# Patient Record
Sex: Male | Born: 1950 | Race: White | Hispanic: No | Smoking: Former smoker
Health system: Southern US, Community
[De-identification: ages and names within clinical notes are randomized; demographics above are authoritative.]

## PROBLEM LIST (undated history)

## (undated) DIAGNOSIS — M96671 Fracture of tibia or fibula following insertion of orthopedic implant, joint prosthesis, or bone plate, right leg: Secondary | ICD-10-CM

## (undated) DIAGNOSIS — E559 Vitamin D deficiency, unspecified: Secondary | ICD-10-CM

## (undated) DIAGNOSIS — G629 Polyneuropathy, unspecified: Secondary | ICD-10-CM

## (undated) HISTORY — DX: Vitamin D deficiency, unspecified: E55.9

## (undated) HISTORY — DX: Fracture of tibia or fibula following insertion of orthopedic implant, joint prosthesis, or bone plate, right leg: M96.671

## (undated) HISTORY — DX: Rider (driver) (passenger) of other motorcycle injured in unspecified traffic accident, initial encounter: V29.99XA

## (undated) HISTORY — DX: Polyneuropathy, unspecified: G62.9

---

## 2001-01-24 ENCOUNTER — Inpatient Hospital Stay (HOSPITAL_COMMUNITY): Admission: AD | Admit: 2001-01-24 | Discharge: 2001-01-27 | Payer: Self-pay | Admitting: Internal Medicine

## 2001-01-24 ENCOUNTER — Encounter: Payer: Self-pay | Admitting: Internal Medicine

## 2001-01-25 ENCOUNTER — Encounter: Payer: Self-pay | Admitting: Internal Medicine

## 2001-01-26 ENCOUNTER — Encounter: Payer: Self-pay | Admitting: Internal Medicine

## 2001-02-28 ENCOUNTER — Encounter: Payer: Self-pay | Admitting: General Surgery

## 2001-02-28 ENCOUNTER — Ambulatory Visit (HOSPITAL_COMMUNITY): Admission: RE | Admit: 2001-02-28 | Discharge: 2001-02-28 | Payer: Self-pay | Admitting: General Surgery

## 2001-09-16 ENCOUNTER — Inpatient Hospital Stay (HOSPITAL_COMMUNITY): Admission: EM | Admit: 2001-09-16 | Discharge: 2001-09-18 | Payer: Self-pay | Admitting: *Deleted

## 2001-09-24 ENCOUNTER — Inpatient Hospital Stay (HOSPITAL_COMMUNITY): Admission: EM | Admit: 2001-09-24 | Discharge: 2001-09-28 | Payer: Self-pay | Admitting: Psychiatry

## 2001-10-26 ENCOUNTER — Emergency Department (HOSPITAL_COMMUNITY): Admission: EM | Admit: 2001-10-26 | Discharge: 2001-10-26 | Payer: Self-pay | Admitting: Emergency Medicine

## 2001-10-27 ENCOUNTER — Encounter: Payer: Self-pay | Admitting: Emergency Medicine

## 2004-06-08 ENCOUNTER — Ambulatory Visit: Payer: Self-pay | Admitting: Internal Medicine

## 2007-01-17 DIAGNOSIS — F411 Generalized anxiety disorder: Secondary | ICD-10-CM | POA: Insufficient documentation

## 2007-01-17 DIAGNOSIS — F329 Major depressive disorder, single episode, unspecified: Secondary | ICD-10-CM | POA: Insufficient documentation

## 2007-01-17 DIAGNOSIS — Z8719 Personal history of other diseases of the digestive system: Secondary | ICD-10-CM | POA: Insufficient documentation

## 2008-11-15 ENCOUNTER — Emergency Department (HOSPITAL_COMMUNITY): Admission: EM | Admit: 2008-11-15 | Discharge: 2008-11-15 | Payer: Self-pay | Admitting: Emergency Medicine

## 2008-11-17 ENCOUNTER — Telehealth (INDEPENDENT_AMBULATORY_CARE_PROVIDER_SITE_OTHER): Payer: Self-pay | Admitting: *Deleted

## 2010-09-03 NOTE — Discharge Summary (Signed)
[place]. [facility]  Patient:    Richard [name], Richard [name] Visit Number: [id number] MRN: [id number]          Service Type: Attending:  Valetta Mole. Swords, M.D. [facility] Dictated by:   Valetta Mole. Swords, M.D. [facility] Adm. Date:  01/24/01 Disc. Date: 01/27/01   CC:         Gordy Savers, M.D.   Discharge Summary  DISCHARGE DIAGNOSES: 1. Diverticulitis. 2. Ileus secondary to above. 3. Tobacco abuse.  DISCHARGE MEDICATIONS:  Augmentin 500 mg p.o. b.i.d. x 5 days.  HOSPITAL PROCEDURES:  CT scan of the abdomen and pelvis demonstrated an inflammatory process in the low to mid pelvis.  A 2 cm fluid collection within the pelvis was noted thought to be related to diverticulitis.  Also noted were some dilated small bowel loops.  Abdominal film on January 26, 2001, demonstrated partial small bowel obstruction with some interval improvement compared to previous films.  LABORATORY DATA:  B-MET on January 26, 2001, was normal.  Blood cultures are negative to date.  Urinalysis was negative on admission.  The CBC on admission demonstrated a white count of 12.6.  On January 26, 2001, white blood count was 6.5.  DISCHARGE CONDITION:  Improved.  FOLLOW-UP PLANS:  Dr. Gordy Savers in three to four weeks.  HOSPITAL COURSE:  The patient was admitted to the hospital service on January 24, 2001.  The patient was admitted with abdominal pain.  CT scan of the pelvis and abdomen was obtained with results as above.  Surgical consult was obtained.  The patient was treated for diverticulitis with IV antibiotics. He improved over his stay in the hospital.  Initially, IV antibiotics were changed to Ceftin.  I sent him home on Augmentin.  On discharge, the patient was doing well, no abdominal pain, tolerating a general diet. Dictated by:   Valetta Mole Swords, M.D. [facility] Attending:  Valetta Mole. Swords, M.D. [facility] DD:  01/27/01 TD:  01/28/01 Job: [id number]

## 2010-09-03 NOTE — Consult Note (Signed)
[place]. [facility]  Patient:    Richard [name], Richard [name] Visit Number: [id number] MRN: [id number]          Service Type: Attending:  Angelia Mould. Derrell Lolling, M.D. Dictated by:   Angelia Mould. Derrell Lolling, M.D. Proc. Date: 01/24/01                            Consultation Report  REASON FOR CONSULTATION:  Evaluate abdominal pain and fever.  HISTORY OF PRESENT ILLNESS:  This is a healthy 60 year old white man who was feeling well until January 22, 2001.  At noon he noted the rather sudden onset of intraumbilical and suprapubic pain.  The pain has been mostly constant since that time, has gotten a little bit worse, certainly has not improved. This morning it went away for a short period of time and then came back and he presented to Dr. Earley Brooke for evaluation.  The patient has been anorexic, but denies nausea or vomiting.  His bowel movements have changed recently and he has had a decrease stool volume and semi-formed stools but no blood was seen. He has not had any prior episodes of this.  He has no voiding symptoms.  He has no GI history.  He was seen in Dr. Felecia Jan office, he had a fever of 102 and was tender in the lower abdomen.  Dr. Earley Brooke was concerned about appendicitis. Dr. Earley Brooke admitted the patient and I am seeing him in consultation.  PAST MEDICAL HISTORY:  Left breast biopsy for bigynecomastia, otherwise no surgical or medical problems.  CURRENT MEDICATIONS:  None.  ALLERGIES:  None.  FAMILY HISTORY:  Father died at age 31, had a myocardial infarction.  Mother living at age 71, no medical problems.  Brother and sister living and well.  SOCIAL HISTORY:  The patient lives in [place].  He is married.  He has one child, age 31.  Smokes 1/2 pack of cigarettes per day.  Drinks about twelve beers per week.  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  REVIEW OF SYSTEMS:  A 12  system review was performed and it is noncontributory except as described above.  PHYSICAL EXAMINATION:  GENERAL:  Pleasant, middle-aged man who appears fit, in mild distress.  Alert and cooperative.  VITAL SIGNS:  Temperature 99.6, heart rate 106, respiratory rate 20, blood pressure 126/78.  HEENT:  Sclera clear.  Extraocular movements intact.  Oropharynx clear.  NECK:  Supple, nontender, no mass, no bruit.  LUNGS:  Clear to auscultation.  No CVA tenderness.  HEART:  Regular rate and rhythm, no murmur.  ABDOMEN:  Soft, nondistended, bowel sounds are diminished.  He is tender in the suprapubic area, a little bit the right and a little bit to the left.  It is not truly lateralizing at this point.  I do not feel a mass or hernia.  The more lateral right lower quadrant and lateral left lower quadrant are nontender.  GENITALIA:  Normal penis, scrotum and testes.  No hernia.  EXTREMITIES:  No edema, good pulses.  NEUROLOGIC:  Grossly within normal limits.  ADMISSION DATA:  All labs are pending.  IMPRESSION: Lower abdominal pain and fever, question atypical appendicitis, question diverticulitis, question other inflammatory process.  PLAN:  The patient will be admitted, IV fluids and IV antibiotics will be started.  I think it is in his best interest to proceed  with CT scanning now to clarify his diagnosis. Dictated by:   Angelia Mould. Derrell Lolling, M.D. Attending:  Angelia Mould. Derrell Lolling, M.D. DD:  01/24/01 TD:  01/25/01 Job: [id number]

## 2010-09-03 NOTE — Discharge Summary (Signed)
[facility]  Patient:    Richard [name], Richard [name] Visit Number: [id number] MRN: [id number]          Service Type: PSY Location: 500 0507 01 Attending Physician:  Jeanice Lim Dictated by:   Reymundo Poll Dub Mikes, M.D. Admit Date:  09/24/2001 Discharge Date: 09/28/2001                             Discharge Summary  CHIEF COMPLAINT AND HISTORY OF PRESENT ILLNESS:  This was the first admission to [facility] for this 60 year old male voluntarily admitted after a suicidal attempt.  Took blood poison as an impulsive act, took one box; had a 40 ounce beer before.  Left a note to his wife.  Wife showed up and took him to ER.  Marital conflicts.  Wife found out he was having an affair.  He was afraid to face his problems.  Denied any active suicidal ideas, homicidal ideas.  PAST PSYCHIATRIC HISTORY:  First time at [facility].  No previous outpatient treatment.  SUBSTANCE ABUSE HISTORY:  Smokes and drinks 40 ounce beer.  Denies any abuse.  PAST MEDICAL HISTORY:  Noncontributory.  MEDICATIONS ON ADMISSION:  Lexapro 10 mg for five to six months; compliant.  PHYSICAL EXAMINATION:  GENERAL:  Performed and failed to show any acute findings.  MENTAL STATUS EXAMINATION ON ADMISSION:  Alert, middle-aged male.  Speech: Normal and clear, goal directed.  Mood: Euthymic.  Affect: Broad.  Thought processes: Coherent, goal directed.  No suicidal ideas, no homicidal ideas. Cognitive: Well preserved.  ADMITTING DIAGNOSES: Axis I:    Major depression, single episode. Axis II:   No diagnosis. Axis III:  No diagnosis. Axis IV:   Moderate. Axis V:    Global assessment of functioning upon admission 40-45, highest            global assessment of functioning in the last year 70.  LABORATORY DATA:  CBC was within normal limits.  Blood chemistries were within normal limits.  Thyroid profile was within normal limits.  Drug screen was positive  for marijuana.  HOSPITAL COURSE:  He was admitted and started in intensive individual and group psychotherapy.  He worked on Pharmacologist, on Optician, dispensing, on dealing with the issues having to do with the loss of his wife and given the fact that she could not get over him having an affair.  He denied any further suicidal ideations.  On June 3, he stated that he was feeling much better, claimed that the suicidal gesture was an impulsive act; he did not think it through.  Claimed that he had been able to deal with the loss of his wife and he was willing to try to recover what he lost after the affair but if she was not willing to pursue it, he was okay with it.  Upon discharge, no suicidal ideas, no homicidal ideas, willing and motivated to pursue outpatient treatment.  DISCHARGE DIAGNOSES: Axis I:    Major depression, single episode. Axis II:   No diagnosis. Axis III:  No diagnosis. Axis IV:   Moderate. Axis V:    Global assessment of functioning upon discharge 60.  DISCHARGE MEDICATIONS:  Lexapro 20 mg per day.  FOLLOWUP:  Clarisse Gouge and [address]. Dictated by:   Reymundo Poll Dub Mikes, M.D. Attending Physician:  Jeanice Lim DD:  10/24/01 TD:  10/24/01 Job: [id number]
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
## 2010-09-03 NOTE — H&P (Signed)
[place]. [facility]  Patient:    Richard [name], Richard [name] Visit Number: [id number] MRN: [id number]          Service Type: Attending:  Gordy Savers, M.D. Dictated by:   Gordy Savers, M.D. Adm. Date:  01/24/01                           History and Physical  CHIEF COMPLAINT:  Abdominal pain.  HISTORY OF PRESENT ILLNESS:  The patient is a 60 year old gentleman who was seen on the day of admission as a new patient, complaining of crampy abdominal pain of three days duration.  He describes a constant aching pain with paroxysmal cramping and sharp shooting pain.  This was aggravated by movement and coughing.  He denies any real nausea or vomiting but has been quite anorexic.  He has had daily bowel movements, but these have been much looser than his norm.  PAST MEDICAL HISTORY:  Unremarkable and has had no hospital admissions and no prior surgeries.  Associated symptoms also included fever to as high as 102 degrees.  The patient was seen in the office where he was noted to be quite tender with a temperature of 101.8 and is now admitted to rule out acute appendicitis.  ALLERGIES:  He has no known allergies.  HABITS:  He is a less than one half pack per day smoker and drinks socially.  MEDICINES:  No chronic medications, has been taking occasional Advil for his pain.  FAMILY HISTORY:  Fairly noncontributory.  Father died at age 59 of an MI. Mother, age 62, is in reasonably good health.  One brother age 19, one sister age 33, both in good health.  PHYSICAL EXAMINATION:  VITAL SIGNS:  Temperature 101.8, pulse rate 108.  GENERAL:  Revealed a well-developed, thin, healthy-appearing male who was uncomfortable.  SKIN:  Unremarkable without rash.  HEENT:  Revealed normal fundi, ears, nose, and throat clear, anicteric.  NECK:  Revealed no adenopathy or neck pain or distention.  CHEST:  Clear.  CARDIOVASCULAR:  S1, S2 normal.  Unremarkable except  for the resting tachycardia.  ABDOMEN:  Soft and nondistended.  He did have a soft epigastric bruit.  He had tenderness fairly diffusely, but max on the right lower quadrant.  He did have considerable tenderness in the periumbilical area and suprapubic area as well. Mild rebound tenderness was noted.  GENITALIA:  External genitalia was normal.  RECTAL:  Revealed Heme-negative stool.  EXTREMITIES:  Negative.  Peripheral pulses full.  IMPRESSION:  Fever, abdominal pain, rule out acute appendicitis.  DISPOSITION:  Will admit to the hospital.  LABORATORY STUDIES:  Included a white count and chemistries will be obtained. Blood cultures will also be obtained.  A CT abdominal scan with contrast will be obtained to rule out acute appendicitis.  Surgical consult will also be obtained tonight. Dictated by:   Gordy Savers, M.D. Attending:  Gordy Savers, M.D. DD:  01/24/01 TD:  01/24/01 Job: [id number]
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
## 2011-01-21 ENCOUNTER — Telehealth: Payer: Self-pay | Admitting: Internal Medicine

## 2011-01-21 NOTE — Telephone Encounter (Signed)
yes

## 2011-01-21 NOTE — Telephone Encounter (Signed)
Pt would like to have psa bloodwork. Can I sch?

## 2011-01-24 NOTE — Telephone Encounter (Signed)
PSA sch for 02-19-2011

## 2011-01-24 NOTE — Telephone Encounter (Signed)
lmom 

## 2011-02-18 ENCOUNTER — Other Ambulatory Visit (INDEPENDENT_AMBULATORY_CARE_PROVIDER_SITE_OTHER): Payer: Self-pay

## 2011-02-18 DIAGNOSIS — Z125 Encounter for screening for malignant neoplasm of prostate: Secondary | ICD-10-CM

## 2011-02-18 LAB — PSA: PSA: 1.47 ng/mL (ref 0.10–4.00)

## 2011-02-24 ENCOUNTER — Telehealth: Payer: Self-pay | Admitting: *Deleted

## 2011-02-24 NOTE — Telephone Encounter (Signed)
Please call/notify patient that lab/test/procedure is normal 

## 2011-02-24 NOTE — Telephone Encounter (Signed)
Pt is aware that lab results are normal.

## 2011-02-24 NOTE — Telephone Encounter (Signed)
Pt is asking for lab results.

## 2011-02-24 NOTE — Telephone Encounter (Signed)
Please call pt with lab results

## 2014-10-09 ENCOUNTER — Telehealth: Payer: Self-pay | Admitting: Internal Medicine

## 2014-10-09 NOTE — Telephone Encounter (Signed)
Error/njr °

## 2014-10-24 ENCOUNTER — Emergency Department (HOSPITAL_COMMUNITY): Payer: BLUE CROSS/BLUE SHIELD

## 2014-10-24 ENCOUNTER — Encounter (HOSPITAL_COMMUNITY): Payer: Self-pay | Admitting: Emergency Medicine

## 2014-10-24 ENCOUNTER — Inpatient Hospital Stay (HOSPITAL_COMMUNITY): Payer: BLUE CROSS/BLUE SHIELD | Admitting: Anesthesiology

## 2014-10-24 ENCOUNTER — Inpatient Hospital Stay (HOSPITAL_COMMUNITY): Payer: BLUE CROSS/BLUE SHIELD

## 2014-10-24 ENCOUNTER — Inpatient Hospital Stay (HOSPITAL_COMMUNITY)
Admission: EM | Admit: 2014-10-24 | Discharge: 2014-10-31 | DRG: 493 | Disposition: A | Discharge status: Skilled Nursing Facility | Payer: BLUE CROSS/BLUE SHIELD | Attending: Orthopedic Surgery | Admitting: Orthopedic Surgery

## 2014-10-24 ENCOUNTER — Encounter (HOSPITAL_COMMUNITY)
Admission: EM | Disposition: A | Discharge status: Skilled Nursing Facility | Payer: Self-pay | Source: Home / Self Care | Attending: Orthopedic Surgery

## 2014-10-24 DIAGNOSIS — R52 Pain, unspecified: Secondary | ICD-10-CM

## 2014-10-24 DIAGNOSIS — S50811A Abrasion of right forearm, initial encounter: Secondary | ICD-10-CM | POA: Diagnosis present

## 2014-10-24 DIAGNOSIS — S82201A Unspecified fracture of shaft of right tibia, initial encounter for closed fracture: Secondary | ICD-10-CM

## 2014-10-24 DIAGNOSIS — S42001B Fracture of unspecified part of right clavicle, initial encounter for open fracture: Secondary | ICD-10-CM

## 2014-10-24 DIAGNOSIS — R112 Nausea with vomiting, unspecified: Secondary | ICD-10-CM | POA: Diagnosis not present

## 2014-10-24 DIAGNOSIS — S0101XA Laceration without foreign body of scalp, initial encounter: Secondary | ICD-10-CM | POA: Diagnosis present

## 2014-10-24 DIAGNOSIS — S82451A Displaced comminuted fracture of shaft of right fibula, initial encounter for closed fracture: Secondary | ICD-10-CM | POA: Diagnosis present

## 2014-10-24 DIAGNOSIS — S82899A Other fracture of unspecified lower leg, initial encounter for closed fracture: Secondary | ICD-10-CM

## 2014-10-24 DIAGNOSIS — Y92410 Unspecified street and highway as the place of occurrence of the external cause: Secondary | ICD-10-CM

## 2014-10-24 DIAGNOSIS — S82401A Unspecified fracture of shaft of right fibula, initial encounter for closed fracture: Secondary | ICD-10-CM

## 2014-10-24 DIAGNOSIS — S82871A Displaced pilon fracture of right tibia, initial encounter for closed fracture: Principal | ICD-10-CM | POA: Diagnosis present

## 2014-10-24 DIAGNOSIS — S50311A Abrasion of right elbow, initial encounter: Secondary | ICD-10-CM | POA: Diagnosis present

## 2014-10-24 DIAGNOSIS — S42001A Fracture of unspecified part of right clavicle, initial encounter for closed fracture: Secondary | ICD-10-CM

## 2014-10-24 DIAGNOSIS — Z419 Encounter for procedure for purposes other than remedying health state, unspecified: Secondary | ICD-10-CM

## 2014-10-24 DIAGNOSIS — D62 Acute posthemorrhagic anemia: Secondary | ICD-10-CM | POA: Diagnosis not present

## 2014-10-24 DIAGNOSIS — M25571 Pain in right ankle and joints of right foot: Secondary | ICD-10-CM | POA: Diagnosis not present

## 2014-10-24 DIAGNOSIS — I998 Other disorder of circulatory system: Secondary | ICD-10-CM

## 2014-10-24 DIAGNOSIS — Z87891 Personal history of nicotine dependence: Secondary | ICD-10-CM | POA: Diagnosis not present

## 2014-10-24 DIAGNOSIS — S42009A Fracture of unspecified part of unspecified clavicle, initial encounter for closed fracture: Secondary | ICD-10-CM

## 2014-10-24 DIAGNOSIS — S42024A Nondisplaced fracture of shaft of right clavicle, initial encounter for closed fracture: Secondary | ICD-10-CM | POA: Diagnosis present

## 2014-10-24 DIAGNOSIS — S40811A Abrasion of right upper arm, initial encounter: Secondary | ICD-10-CM | POA: Diagnosis present

## 2014-10-24 DIAGNOSIS — T1490XA Injury, unspecified, initial encounter: Secondary | ICD-10-CM

## 2014-10-24 DIAGNOSIS — S82899B Other fracture of unspecified lower leg, initial encounter for open fracture type I or II: Secondary | ICD-10-CM

## 2014-10-24 HISTORY — PX: I & D EXTREMITY: SHX5045

## 2014-10-24 HISTORY — PX: EXTERNAL FIXATION LEG: SHX1549

## 2014-10-24 LAB — COMPREHENSIVE METABOLIC PANEL
ALK PHOS: 52 U/L (ref 38–126)
ALT: 25 U/L (ref 17–63)
ANION GAP: 12 (ref 5–15)
AST: 42 U/L — ABNORMAL HIGH (ref 15–41)
Albumin: 3.3 g/dL — ABNORMAL LOW (ref 3.5–5.0)
BUN: 23 mg/dL — ABNORMAL HIGH (ref 6–20)
CHLORIDE: 109 mmol/L (ref 101–111)
CO2: 19 mmol/L — AB (ref 22–32)
CREATININE: 1.38 mg/dL — AB (ref 0.61–1.24)
Calcium: 8.5 mg/dL — ABNORMAL LOW (ref 8.9–10.3)
GFR calc Af Amer: >60 mL/min (ref >60)
GFR, EST NON AFRICAN AMERICAN: 54 mL/min — AB (ref >60)
GLUCOSE: 117 mg/dL — AB (ref 65–99)
Potassium: 4.1 mmol/L (ref 3.5–5.1)
Sodium: 140 mmol/L (ref 135–145)
Total Bilirubin: 0.6 mg/dL (ref 0.3–1.2)
Total Protein: 5.7 g/dL — ABNORMAL LOW (ref 6.5–8.1)

## 2014-10-24 LAB — CBC
HCT: 37.2 % — ABNORMAL LOW (ref 39.0–52.0)
HEMOGLOBIN: 12.8 g/dL — AB (ref 13.0–17.0)
MCH: 30.6 pg (ref 26.0–34.0)
MCHC: 34.4 g/dL (ref 30.0–36.0)
MCV: 89 fL (ref 78.0–100.0)
PLATELETS: 374 10*3/uL (ref 150–400)
RBC: 4.18 MIL/uL — ABNORMAL LOW (ref 4.22–5.81)
RDW: 12.9 % (ref 11.5–15.5)
WBC: 7.5 10*3/uL (ref 4.0–10.5)

## 2014-10-24 LAB — PREPARE FRESH FROZEN PLASMA
Unit division: 0
Unit division: 0

## 2014-10-24 LAB — PROTIME-INR
INR: 1.05 (ref 0.00–1.49)
Prothrombin Time: 13.9 seconds (ref 11.6–15.2)

## 2014-10-24 LAB — ETHANOL: ALCOHOL ETHYL (B): 36 mg/dL — AB (ref <5)

## 2014-10-24 LAB — ABO/RH: ABO/RH(D): O POS

## 2014-10-24 LAB — CDS SEROLOGY

## 2014-10-24 SURGERY — EXTERNAL FIXATION, LOWER EXTREMITY
Anesthesia: General | Site: Leg Upper | Laterality: Right

## 2014-10-24 MED ORDER — ONDANSETRON HCL 4 MG/2ML IJ SOLN
4.0000 mg | Freq: Four times a day (QID) | INTRAMUSCULAR | Status: DC | PRN
Start: 2014-10-24 — End: 2014-11-01
  Administered 2014-10-25 – 2014-10-28 (×5): 4 mg via INTRAVENOUS
  Filled 2014-10-24 (×5): qty 2

## 2014-10-24 MED ORDER — ONDANSETRON HCL 4 MG PO TABS: 4.0000 mg | ORAL_TABLET | Freq: Four times a day (QID) | ORAL | Status: DC | PRN

## 2014-10-24 MED ORDER — FENTANYL CITRATE (PF) 100 MCG/2ML IJ SOLN
INTRAMUSCULAR | Status: DC | PRN
  Administered 2014-10-24: 100 ug via INTRAVENOUS
  Administered 2014-10-24 (×3): 50 ug via INTRAVENOUS

## 2014-10-24 MED ORDER — BISACODYL 10 MG RE SUPP
10.0000 mg | Freq: Every day | RECTAL | Status: DC | PRN
  Administered 2014-10-30: 10 mg via RECTAL
  Filled 2014-10-24: qty 1

## 2014-10-24 MED ORDER — PROPOFOL 10 MG/ML IV BOLUS
INTRAVENOUS | Status: DC | PRN
  Administered 2014-10-24: 200 mg via INTRAVENOUS

## 2014-10-24 MED ORDER — ETOMIDATE 2 MG/ML IV SOLN
INTRAVENOUS | Status: AC | PRN
  Administered 2014-10-24: 15 mg via INTRAVENOUS

## 2014-10-24 MED ORDER — GLYCOPYRROLATE 0.2 MG/ML IJ SOLN
INTRAMUSCULAR | Status: AC
  Filled 2014-10-24: qty 2

## 2014-10-24 MED ORDER — ENOXAPARIN SODIUM 40 MG/0.4ML ~~LOC~~ SOLN
40.0000 mg | SUBCUTANEOUS | Status: DC
  Administered 2014-10-25 – 2014-10-27 (×3): 40 mg via SUBCUTANEOUS
  Filled 2014-10-24 (×4): qty 0.4

## 2014-10-24 MED ORDER — MORPHINE SULFATE 2 MG/ML IJ SOLN
INTRAMUSCULAR | Status: AC
  Filled 2014-10-24: qty 3

## 2014-10-24 MED ORDER — SILVER SULFADIAZINE 1 % EX CREA
TOPICAL_CREAM | CUTANEOUS | Status: AC
  Filled 2014-10-24: qty 85

## 2014-10-24 MED ORDER — DOCUSATE SODIUM 100 MG PO CAPS
100.0000 mg | ORAL_CAPSULE | Freq: Two times a day (BID) | ORAL | Status: DC
  Administered 2014-10-25 – 2014-10-31 (×12): 100 mg via ORAL
  Filled 2014-10-24 (×13): qty 1

## 2014-10-24 MED ORDER — CEFAZOLIN SODIUM-DEXTROSE 2-3 GM-% IV SOLR
INTRAVENOUS | Status: AC
  Administered 2014-10-24: 2000 mg
  Filled 2014-10-24: qty 50

## 2014-10-24 MED ORDER — PANTOPRAZOLE SODIUM 40 MG PO TBEC
40.0000 mg | DELAYED_RELEASE_TABLET | Freq: Every day | ORAL | Status: DC
  Administered 2014-10-26 – 2014-10-31 (×5): 40 mg via ORAL
  Filled 2014-10-24 (×5): qty 1

## 2014-10-24 MED ORDER — ONDANSETRON HCL 4 MG/2ML IJ SOLN: 4.0000 mg | Freq: Four times a day (QID) | INTRAMUSCULAR | Status: DC | PRN

## 2014-10-24 MED ORDER — GLYCOPYRROLATE 0.2 MG/ML IJ SOLN
INTRAMUSCULAR | Status: DC | PRN
  Administered 2014-10-24: 0.4 mg via INTRAVENOUS

## 2014-10-24 MED ORDER — ROCURONIUM BROMIDE 50 MG/5ML IV SOLN
INTRAVENOUS | Status: AC
  Filled 2014-10-24: qty 1

## 2014-10-24 MED ORDER — FENTANYL CITRATE (PF) 100 MCG/2ML IJ SOLN
INTRAMUSCULAR | Status: AC
  Filled 2014-10-24: qty 2

## 2014-10-24 MED ORDER — NEOSTIGMINE METHYLSULFATE 10 MG/10ML IV SOLN
INTRAVENOUS | Status: AC
  Filled 2014-10-24: qty 1

## 2014-10-24 MED ORDER — PANTOPRAZOLE SODIUM 40 MG IV SOLR
40.0000 mg | Freq: Every day | INTRAVENOUS | Status: DC
  Administered 2014-10-25: 40 mg via INTRAVENOUS
  Filled 2014-10-24 (×2): qty 40

## 2014-10-24 MED ORDER — ACETAMINOPHEN 650 MG RE SUPP: 650.0000 mg | Freq: Four times a day (QID) | RECTAL | Status: DC | PRN

## 2014-10-24 MED ORDER — DOCUSATE SODIUM 100 MG PO CAPS: 100.0000 mg | ORAL_CAPSULE | Freq: Two times a day (BID) | ORAL | Status: DC

## 2014-10-24 MED ORDER — HYDROMORPHONE HCL 1 MG/ML IJ SOLN
INTRAMUSCULAR | Status: AC
  Administered 2014-10-24: 0.5 mg via INTRAVENOUS
  Filled 2014-10-24: qty 1

## 2014-10-24 MED ORDER — MEPERIDINE HCL 25 MG/ML IJ SOLN: 6.2500 mg | INTRAMUSCULAR | Status: DC | PRN

## 2014-10-24 MED ORDER — LACTATED RINGERS IV SOLN
INTRAVENOUS | Status: DC | PRN
  Administered 2014-10-24: 20:00:00 via INTRAVENOUS

## 2014-10-24 MED ORDER — ENOXAPARIN SODIUM 30 MG/0.3ML ~~LOC~~ SOLN: 30.0000 mg | SUBCUTANEOUS | Status: DC

## 2014-10-24 MED ORDER — SODIUM CHLORIDE 0.9 % IV SOLN
INTRAVENOUS | Status: AC | PRN
  Administered 2014-10-24 (×2): 1000 mL via INTRAVENOUS

## 2014-10-24 MED ORDER — METOCLOPRAMIDE HCL 5 MG PO TABS
5.0000 mg | ORAL_TABLET | Freq: Three times a day (TID) | ORAL | Status: DC | PRN
  Filled 2014-10-24: qty 2

## 2014-10-24 MED ORDER — CEFAZOLIN SODIUM 1-5 GM-% IV SOLN
1.0000 g | Freq: Four times a day (QID) | INTRAVENOUS | Status: AC
  Administered 2014-10-25 – 2014-10-27 (×12): 1 g via INTRAVENOUS
  Filled 2014-10-24 (×14): qty 50

## 2014-10-24 MED ORDER — LIDOCAINE HCL (CARDIAC) 20 MG/ML IV SOLN
INTRAVENOUS | Status: AC
  Filled 2014-10-24: qty 5

## 2014-10-24 MED ORDER — HYDROMORPHONE HCL 1 MG/ML IJ SOLN
1.0000 mg | INTRAMUSCULAR | Status: DC | PRN
  Administered 2014-10-25: 1 mg via INTRAVENOUS
  Filled 2014-10-24: qty 1

## 2014-10-24 MED ORDER — MIDAZOLAM HCL 5 MG/5ML IJ SOLN
INTRAMUSCULAR | Status: DC | PRN
  Administered 2014-10-24: 2 mg via INTRAVENOUS

## 2014-10-24 MED ORDER — HYDROMORPHONE HCL 1 MG/ML IJ SOLN
0.2500 mg | INTRAMUSCULAR | Status: DC | PRN
  Administered 2014-10-24 (×4): 0.5 mg via INTRAVENOUS

## 2014-10-24 MED ORDER — SODIUM CHLORIDE 0.9 % IV SOLN
INTRAVENOUS | Status: DC
  Administered 2014-10-24: 125 mL/h via INTRAVENOUS
  Administered 2014-10-25: 125 mL via INTRAVENOUS
  Administered 2014-10-25 – 2014-10-26 (×3): via INTRAVENOUS

## 2014-10-24 MED ORDER — ONDANSETRON HCL 4 MG/2ML IJ SOLN
INTRAMUSCULAR | Status: AC
  Filled 2014-10-24: qty 2

## 2014-10-24 MED ORDER — ALBUMIN HUMAN 5 % IV SOLN
INTRAVENOUS | Status: DC | PRN
  Administered 2014-10-24: 21:00:00 via INTRAVENOUS

## 2014-10-24 MED ORDER — SODIUM CHLORIDE 0.9 % IR SOLN
Status: DC | PRN
  Administered 2014-10-24: 1000 mL

## 2014-10-24 MED ORDER — CEFAZOLIN SODIUM-DEXTROSE 2-3 GM-% IV SOLR
INTRAVENOUS | Status: DC | PRN
  Administered 2014-10-24: 2 g via INTRAVENOUS

## 2014-10-24 MED ORDER — FENTANYL CITRATE (PF) 100 MCG/2ML IJ SOLN
INTRAMUSCULAR | Status: AC | PRN
  Administered 2014-10-24: 100 ug via INTRAVENOUS

## 2014-10-24 MED ORDER — FENTANYL CITRATE (PF) 100 MCG/2ML IJ SOLN
100.0000 ug | Freq: Once | INTRAMUSCULAR | Status: AC
  Administered 2014-10-24: 100 ug via INTRAVENOUS

## 2014-10-24 MED ORDER — ROCURONIUM BROMIDE 100 MG/10ML IV SOLN
INTRAVENOUS | Status: DC | PRN
  Administered 2014-10-24: 25 mg via INTRAVENOUS

## 2014-10-24 MED ORDER — PROPOFOL 10 MG/ML IV BOLUS
INTRAVENOUS | Status: AC
  Filled 2014-10-24: qty 20

## 2014-10-24 MED ORDER — OXYCODONE HCL 5 MG/5ML PO SOLN: 5.0000 mg | Freq: Once | ORAL | Status: DC | PRN

## 2014-10-24 MED ORDER — NEOSTIGMINE METHYLSULFATE 10 MG/10ML IV SOLN
INTRAVENOUS | Status: DC | PRN
  Administered 2014-10-24: 4 mg via INTRAVENOUS

## 2014-10-24 MED ORDER — OXYCODONE HCL 5 MG PO TABS: 5.0000 mg | ORAL_TABLET | Freq: Once | ORAL | Status: DC | PRN

## 2014-10-24 MED ORDER — LIDOCAINE HCL (CARDIAC) 20 MG/ML IV SOLN
INTRAVENOUS | Status: DC | PRN
  Administered 2014-10-24: 50 mg via INTRAVENOUS

## 2014-10-24 MED ORDER — CEFAZOLIN SODIUM-DEXTROSE 2-3 GM-% IV SOLR
INTRAVENOUS | Status: AC
  Filled 2014-10-24: qty 50

## 2014-10-24 MED ORDER — CEFAZOLIN SODIUM 1-5 GM-% IV SOLN: 1.0000 g | Freq: Three times a day (TID) | INTRAVENOUS | Status: DC

## 2014-10-24 MED ORDER — HYDROMORPHONE HCL 1 MG/ML IJ SOLN
0.5000 mg | INTRAMUSCULAR | Status: DC | PRN
  Administered 2014-10-24 (×2): 0.5 mg via INTRAVENOUS

## 2014-10-24 MED ORDER — SUCCINYLCHOLINE CHLORIDE 20 MG/ML IJ SOLN
INTRAMUSCULAR | Status: DC | PRN
  Administered 2014-10-24: 140 mg via INTRAVENOUS

## 2014-10-24 MED ORDER — FENTANYL CITRATE (PF) 250 MCG/5ML IJ SOLN
INTRAMUSCULAR | Status: AC
  Filled 2014-10-24: qty 5

## 2014-10-24 MED ORDER — ONDANSETRON HCL 4 MG/2ML IJ SOLN
INTRAMUSCULAR | Status: DC | PRN
  Administered 2014-10-24: 4 mg via INTRAVENOUS

## 2014-10-24 MED ORDER — MORPHINE SULFATE 4 MG/ML IJ SOLN
6.0000 mg | Freq: Once | INTRAMUSCULAR | Status: AC
  Administered 2014-10-24: 6 mg via INTRAVENOUS

## 2014-10-24 MED ORDER — SUCCINYLCHOLINE CHLORIDE 20 MG/ML IJ SOLN
INTRAMUSCULAR | Status: AC
  Filled 2014-10-24: qty 1

## 2014-10-24 MED ORDER — HYDROMORPHONE HCL 1 MG/ML IJ SOLN
1.0000 mg | INTRAMUSCULAR | Status: DC | PRN
  Administered 2014-10-25: 1 mg via INTRAVENOUS
  Administered 2014-10-25 (×2): 2 mg via INTRAVENOUS
  Administered 2014-10-25 (×2): 1 mg via INTRAVENOUS
  Administered 2014-10-25: 2 mg via INTRAVENOUS
  Administered 2014-10-26: 1 mg via INTRAVENOUS
  Administered 2014-10-26 (×2): 2 mg via INTRAVENOUS
  Filled 2014-10-24: qty 2
  Filled 2014-10-24: qty 1
  Filled 2014-10-24: qty 2
  Filled 2014-10-24 (×2): qty 1
  Filled 2014-10-24 (×4): qty 2

## 2014-10-24 MED ORDER — MIDAZOLAM HCL 2 MG/2ML IJ SOLN
INTRAMUSCULAR | Status: AC
  Filled 2014-10-24: qty 2

## 2014-10-24 MED ORDER — OXYCODONE HCL 5 MG PO TABS
5.0000 mg | ORAL_TABLET | ORAL | Status: DC | PRN
  Administered 2014-10-24 – 2014-10-25 (×2): 10 mg via ORAL
  Administered 2014-10-25: 5 mg via ORAL
  Administered 2014-10-25 (×2): 10 mg via ORAL
  Filled 2014-10-24 (×2): qty 2
  Filled 2014-10-24: qty 1
  Filled 2014-10-24 (×2): qty 2

## 2014-10-24 MED ORDER — PHENYLEPHRINE HCL 10 MG/ML IJ SOLN
INTRAMUSCULAR | Status: DC | PRN
  Administered 2014-10-24 (×3): 80 ug via INTRAVENOUS

## 2014-10-24 MED ORDER — ACETAMINOPHEN 325 MG PO TABS
650.0000 mg | ORAL_TABLET | Freq: Four times a day (QID) | ORAL | Status: DC | PRN
  Administered 2014-10-29: 650 mg via ORAL
  Filled 2014-10-24: qty 2

## 2014-10-24 MED ORDER — METOCLOPRAMIDE HCL 5 MG/ML IJ SOLN
5.0000 mg | Freq: Three times a day (TID) | INTRAMUSCULAR | Status: DC | PRN
  Filled 2014-10-24: qty 2

## 2014-10-24 SURGICAL SUPPLY — 61 items
BANDAGE ELASTIC 4 VELCRO ST LF (GAUZE/BANDAGES/DRESSINGS) ×3 IMPLANT
BANDAGE ELASTIC 6 VELCRO ST LF (GAUZE/BANDAGES/DRESSINGS) ×3 IMPLANT
BNDG COHESIVE 6X5 TAN STRL LF (GAUZE/BANDAGES/DRESSINGS) ×3 IMPLANT
BNDG GAUZE ELAST 4 BULKY (GAUZE/BANDAGES/DRESSINGS) ×3 IMPLANT
CANISTER WOUND CARE 500ML ATS (WOUND CARE) ×3 IMPLANT
COVER SURGICAL LIGHT HANDLE (MISCELLANEOUS) ×3 IMPLANT
DRAPE C-ARM 42X72 X-RAY (DRAPES) IMPLANT
DRAPE C-ARMOR (DRAPES) ×3 IMPLANT
DRAPE INCISE IOBAN 66X45 STRL (DRAPES) ×3 IMPLANT
DRAPE U-SHAPE 47X51 STRL (DRAPES) ×3 IMPLANT
DRSG VAC ATS SM SENSATRAC (GAUZE/BANDAGES/DRESSINGS) ×3 IMPLANT
ELECT REM PT RETURN 9FT ADLT (ELECTROSURGICAL) ×3
ELECTRODE REM PT RTRN 9FT ADLT (ELECTROSURGICAL) ×2 IMPLANT
GAUZE SPONGE 4X4 12PLY STRL (GAUZE/BANDAGES/DRESSINGS) ×3 IMPLANT
GAUZE XEROFORM 5X9 LF (GAUZE/BANDAGES/DRESSINGS) ×3 IMPLANT
GLOVE BIO SURGEON STRL SZ7.5 (GLOVE) ×9 IMPLANT
GLOVE BIOGEL PI IND STRL 6.5 (GLOVE) ×2 IMPLANT
GLOVE BIOGEL PI IND STRL 7.0 (GLOVE) ×2 IMPLANT
GLOVE BIOGEL PI IND STRL 7.5 (GLOVE) ×4 IMPLANT
GLOVE BIOGEL PI IND STRL 8 (GLOVE) ×2 IMPLANT
GLOVE BIOGEL PI INDICATOR 6.5 (GLOVE) ×1
GLOVE BIOGEL PI INDICATOR 7.0 (GLOVE) ×1
GLOVE BIOGEL PI INDICATOR 7.5 (GLOVE) ×2
GLOVE BIOGEL PI INDICATOR 8 (GLOVE) ×1
GLOVE ECLIPSE 7.0 STRL STRAW (GLOVE) ×6 IMPLANT
GLOVE ECLIPSE 8.0 STRL XLNG CF (GLOVE) ×3 IMPLANT
GLOVE NEODERM STRL 7.5 LF PF (GLOVE) IMPLANT
GLOVE SURG NEODERM 7.5  LF PF (GLOVE)
GOWN STRL REIN XL XLG (GOWN DISPOSABLE) ×3 IMPLANT
HANDPIECE INTERPULSE COAX TIP (DISPOSABLE)
KIT BASIN OR (CUSTOM PROCEDURE TRAY) ×3 IMPLANT
KIT ROOM TURNOVER OR (KITS) ×3 IMPLANT
NEEDLE 22X1 1/2 (OR ONLY) (NEEDLE) IMPLANT
NS IRRIG 1000ML POUR BTL (IV SOLUTION) ×3 IMPLANT
PACK ORTHO EXTREMITY (CUSTOM PROCEDURE TRAY) ×3 IMPLANT
PAD ARMBOARD 7.5X6 YLW CONV (MISCELLANEOUS) ×6 IMPLANT
PAD NEG PRESSURE SENSATRAC (MISCELLANEOUS) ×3 IMPLANT
PADDING CAST COTTON 6X4 STRL (CAST SUPPLIES) ×9 IMPLANT
PIN APEX 5X180MM EXFIX (EXFIX) ×6 IMPLANT
PIN CLAMP 5H 30DEG POST (EXFIX) ×3 IMPLANT
PIN TO ROD COUPLING EXFIX (EXFIX) ×12 IMPLANT
PIN TRANSFIXING EXFIX (EXFIX) ×6 IMPLANT
ROD HOFFMANN3 CONNECT 11X150 (EXFIX) ×6 IMPLANT
ROD HOFFMANN3 CONNECT 11X350 (EXFIX) ×6 IMPLANT
ROD TO ROD COUPLING EXFIX (EXFIX) ×12 IMPLANT
SET CYSTO W/LG BORE CLAMP LF (SET/KITS/TRAYS/PACK) ×3 IMPLANT
SET HNDPC FAN SPRY TIP SCT (DISPOSABLE) IMPLANT
SLING ARM FOAM STRAP XLG (SOFTGOODS) ×3 IMPLANT
SPONGE LAP 18X18 X RAY DECT (DISPOSABLE) ×3 IMPLANT
SPONGE SCRUB IODOPHOR (GAUZE/BANDAGES/DRESSINGS) ×3 IMPLANT
STAPLER VISISTAT 35W (STAPLE) IMPLANT
STOCKINETTE IMPERVIOUS LG (DRAPES) ×3 IMPLANT
SUT ETHILON 2 0 FS 18 (SUTURE) ×9 IMPLANT
SUT ETHILON 3 0 PS 1 (SUTURE) ×3 IMPLANT
SYR CONTROL 10ML LL (SYRINGE) IMPLANT
TOWEL OR 17X24 6PK STRL BLUE (TOWEL DISPOSABLE) ×6 IMPLANT
TOWEL OR 17X26 10 PK STRL BLUE (TOWEL DISPOSABLE) ×6 IMPLANT
TUBE CONNECTING 12X1/4 (SUCTIONS) ×3 IMPLANT
UNDERPAD 30X30 INCONTINENT (UNDERPADS AND DIAPERS) ×3 IMPLANT
WATER STERILE IRR 1000ML POUR (IV SOLUTION) ×6 IMPLANT
YANKAUER SUCT BULB TIP NO VENT (SUCTIONS) ×3 IMPLANT
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
## 2014-10-24 NOTE — ED Notes (Signed)
Pt back in room from CT 
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
## 2014-10-24 NOTE — Consult Note (Signed)
Pt examined post Ex fix to right ankle.  Foot is pink.  There is brisk biphasic PT doppler flow and brisk monophasic DP doppler will continue to follow clinically for now  Ruta Hinds, MD [facility] [place]: [phone] Pager: [phone]

## 2014-10-24 NOTE — ED Notes (Signed)
Ortho tech placing cast on Right leg
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
## 2014-10-24 NOTE — ED Notes (Signed)
Radiology at bedside
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
## 2014-10-24 NOTE — OR Nursing (Addendum)
Dr. Oneida Alar doppler right foot pulses noted. Fractured right clavicle sling application at this time per Dr. Windy Kalata.Right arm cleaned saline, applied silvadene cream,4x4 and ace wrap.

## 2014-10-24 NOTE — Transfer of Care (Signed)
Immediate Anesthesia Transfer of Care Note  Patient: Richard [name]  Procedure(s) Performed: Procedure(s): EXTERNAL FIXATION LEG (N/A) IRRIGATION AND DEBRIDEMENT EXTREMITY (Right)  Patient Location: PACU  Anesthesia Type:General  Level of Consciousness: awake, alert  and oriented  Airway & Oxygen Therapy: Patient Spontanous Breathing and Patient connected to face mask oxygen  Post-op Assessment: Report given to RN and Post -op Vital signs reviewed and stable  Post vital signs: Reviewed and stable  Last Vitals:  Filed Vitals:   10/24/14 1945  BP: 106/69  Pulse: 81  Temp:   Resp: 14    Complications: No apparent anesthesia complications

## 2014-10-24 NOTE — Anesthesia Procedure Notes (Signed)
Procedure Name: Intubation Date/Time: 10/24/2014 8:17 PM Performed by: Jeramey Lanuza S Pre-anesthesia Checklist: Patient identified, Timeout performed, Emergency Drugs available, Patient being monitored and Suction available Patient Re-evaluated:Patient Re-evaluated prior to inductionOxygen Delivery Method: Circle system utilized Preoxygenation: Pre-oxygenation with 100% oxygen Intubation Type: IV induction, Rapid sequence and Cricoid Pressure applied Ventilation: Mask ventilation without difficulty Laryngoscope Size: Mac and 4 Grade View: Grade I Tube type: Oral Tube size: 8.0 mm Number of attempts: 1 Airway Equipment and Method: Stylet Placement Confirmation: ETT inserted through vocal cords under direct vision,  positive ETCO2 and breath sounds checked- equal and bilateral Secured at: 22 cm Tube secured with: Tape Dental Injury: Teeth and Oropharynx as per pre-operative assessment

## 2014-10-24 NOTE — Anesthesia Preprocedure Evaluation (Addendum)
Anesthesia Evaluation  Patient identified by MRN, date of birth, ID band Patient awake    Reviewed: Allergy & Precautions, NPO status , Patient's Chart, lab work & pertinent test results  History of Anesthesia Complications Negative for: history of anesthetic complications  Airway Mallampati: II  TM Distance: >3 FB Neck ROM: Full    Dental  (+) Teeth Intact, Dental Advisory Given, Poor Dentition   Pulmonary former smoker,  breath sounds clear to auscultation        Cardiovascular negative cardio ROS  Rhythm:Regular Rate:Normal     Neuro/Psych negative neurological ROS     GI/Hepatic negative GI ROS, (+)     substance abuse  alcohol use,   Endo/Other  negative endocrine ROS  Renal/GU negative Renal ROS  negative genitourinary   Musculoskeletal negative musculoskeletal ROS (+)   Abdominal   Peds  Hematology negative hematology ROS (+)   Anesthesia Other Findings   Reproductive/Obstetrics                          Anesthesia Physical Anesthesia Plan  ASA: II and emergent  Anesthesia Plan: General   Post-op Pain Management:    Induction: Intravenous, Rapid sequence and Cricoid pressure planned  Airway Management Planned: Oral ETT  Additional Equipment:   Intra-op Plan:   Post-operative Plan: Extubation in OR  Informed Consent: I have reviewed the patients History and Physical, chart, labs and discussed the procedure including the risks, benefits and alternatives for the proposed anesthesia with the patient or authorized representative who has indicated his/her understanding and acceptance.   Dental advisory given  Plan Discussed with: CRNA, Anesthesiologist and Surgeon  Anesthesia Plan Comments:        Anesthesia Quick Evaluation
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
## 2014-10-24 NOTE — ED Notes (Signed)
Pt in CT with RN

## 2014-10-24 NOTE — ED Notes (Signed)
Ortho MD at bedside.

## 2014-10-24 NOTE — ED Notes (Signed)
FAST exam negative

## 2014-10-24 NOTE — Anesthesia Postprocedure Evaluation (Signed)
  Anesthesia Post-op Note  Patient: Richard [name]  Procedure(s) Performed: Procedure(s): EXTERNAL FIXATION LEG (N/A) IRRIGATION AND DEBRIDEMENT EXTREMITY (Right)  Patient Location: PACU  Anesthesia Type: General   Level of Consciousness: awake, alert  and oriented  Airway and Oxygen Therapy: Patient Spontanous Breathing  Post-op Pain: mild  Post-op Assessment: Post-op Vital signs reviewed  Post-op Vital Signs: Reviewed  Last Vitals:  Filed Vitals:   10/24/14 2147  BP: 142/76  Pulse:   Temp: 36.9 C  Resp:     Complications: No apparent anesthesia complications

## 2014-10-24 NOTE — ED Notes (Signed)
Dr. Venora Maples reducing right ankle. Pt sedated

## 2014-10-24 NOTE — ED Notes (Signed)
Blood arrived at bedside

## 2014-10-24 NOTE — Op Note (Signed)
10/24/2014  9:45 PM  PATIENT:  Richard [name]    PRE-OPERATIVE DIAGNOSIS:  fractured tib/fib  POST-OPERATIVE DIAGNOSIS:  Same  PROCEDURE:  EXTERNAL FIXATION LEG, IRRIGATION AND DEBRIDEMENT EXTREMITY  SURGEON:  Mose Colaizzi D, MD  ASSISTANT: Lovett Calender, PA-C, She was present and scrubbed throughout the case, critical for completion in a timely fashion, and for retraction, instrumentation, and closure.   ANESTHESIA:   gen  PREOPERATIVE INDICATIONS:  ANOTHY BUFANO is a  64 y.o. male with a diagnosis of fractured tib/fib who failed conservative measures and elected for surgical management.    The risks benefits and alternatives were discussed with the patient preoperatively including but not limited to the risks of infection, bleeding, nerve injury, cardiopulmonary complications, the need for revision surgery, among others, and the patient was willing to proceed.  OPERATIVE IMPLANTS: Hoffman ex-fix  OPERATIVE FINDINGS: small open fracture at mid-tibia with stable bone, unstable pilon. 2cm head laceration  BLOOD LOSS: 737  COMPLICATIONS: none  TOURNIQUET TIME: none  OPERATIVE PROCEDURE:  Patient was identified in the preoperative holding area and site was marked by me He was transported to the operating theater and placed on the table in supine position taking care to pad all bony prominences. After a preincinduction time out anesthesia was induced. The right lower extremity was prepped and draped in normal sterile fashion and a pre-incision timeout was performed. He received ancef for preoperative antibiotics.   Prior prepping and draping the leg we performed a thorough irrigation of his right arm abrasions these were superficial with a very small subdermal portion that did not track below the fat.  I also used a sterile technique to prep his head laceration with Betadine and placed 2 staples reapproximating this laceration and a dressing was applied.  Next we prepped and  draped the right lower extremity.  I initially explored his proximal wounds there was a circular 3 cm laceration and a 1 cm one proximal to that. Through these I can palpate his anterior tibial crest. There was a very small chip piece of half a centimeter by a half a centimeter by 1 mm thick off the anterior tibial crest. The bone was completely stable there were no foreign bodies I did create an incision at the tip of the extent of tracking of this degloving portion and there was no foreign body or contamination here either. We thoroughly irrigated this with 6 L of normal normal saline. I then closed my surgical incision and reapproximated some of the skin from his traumatic wounds leaving it open to drain. We then placed a wound VAC over this portion of his wound.  Next I turned my attention to his [place] fracture given his swelling and possible vascular injury we elected to place an external fixator Dr. Oneida Alar had consults it on him and felt that the vessels were spasmed and starting to open up again but was going to check them after her external fixator placement.  I placed 2 anterior tibial pins keeping them away from the planned surgical site and then placed 2 transfix pins in the calcaneus. I took multiple x-rays and was happy with the placement of both. I then built a delta frame reduced his fracture and secured all components of the frame.  I took multiple x-rays of his fracture and was happy with the reduction his skin was not tented and there was no compromise to his skin.  At this point sterile dressings were applied Dr. Oneida Alar to come  in we Doppler his pulses and they were dopplerable dopplerable and palpable. Toes were warm and well-perfused.  He was then taken to the PACU in stable condition.  POST OPERATIVE PLAN: NWB RUE. CHemical dvt px. he will need definitive fixation of his right ankle once swelling subsides likely next week for the following.    This note was generated using a  template and dragon dictation system. In light of that, I have reviewed the note and all aspects of it are applicable to this case. Any dictation errors are due to the computerized dictation system.

## 2014-10-24 NOTE — ED Notes (Signed)
Pt awake/alert after foot reduced

## 2014-10-24 NOTE — ED Notes (Signed)
Vascular surgeon arrived

## 2014-10-25 ENCOUNTER — Inpatient Hospital Stay (HOSPITAL_COMMUNITY): Payer: BLUE CROSS/BLUE SHIELD

## 2014-10-25 LAB — CBC
HEMATOCRIT: 33.3 % — AB (ref 39.0–52.0)
Hemoglobin: 11.3 g/dL — ABNORMAL LOW (ref 13.0–17.0)
MCH: 30.9 pg (ref 26.0–34.0)
MCHC: 33.9 g/dL (ref 30.0–36.0)
MCV: 91 fL (ref 78.0–100.0)
Platelets: 179 10*3/uL (ref 150–400)
RBC: 3.66 MIL/uL — ABNORMAL LOW (ref 4.22–5.81)
RDW: 13 % (ref 11.5–15.5)
WBC: 7.6 10*3/uL (ref 4.0–10.5)

## 2014-10-25 LAB — BASIC METABOLIC PANEL
ANION GAP: 6 (ref 5–15)
BUN: 18 mg/dL (ref 6–20)
CHLORIDE: 109 mmol/L (ref 101–111)
CO2: 23 mmol/L (ref 22–32)
Calcium: 8 mg/dL — ABNORMAL LOW (ref 8.9–10.3)
Creatinine, Ser: 1.14 mg/dL (ref 0.61–1.24)
GFR calc non Af Amer: >60 mL/min (ref >60)
Glucose, Bld: 144 mg/dL — ABNORMAL HIGH (ref 65–99)
Potassium: 4.1 mmol/L (ref 3.5–5.1)
SODIUM: 138 mmol/L (ref 135–145)

## 2014-10-25 LAB — MRSA PCR SCREENING: MRSA by PCR: NEGATIVE

## 2014-10-25 MED ORDER — PROMETHAZINE HCL 25 MG/ML IJ SOLN
12.5000 mg | Freq: Four times a day (QID) | INTRAMUSCULAR | Status: DC | PRN
  Administered 2014-10-25: 12.5 mg via INTRAVENOUS
  Filled 2014-10-25: qty 1

## 2014-10-25 MED ORDER — PNEUMOCOCCAL VAC POLYVALENT 25 MCG/0.5ML IJ INJ: 0.5000 mL | INJECTION | INTRAMUSCULAR | Status: DC

## 2014-10-25 NOTE — Consult Note (Signed)
[facility]  Subjective  - sore right arm shoulder, right ankle, awake and alert nausea vomiting   Objective 118/80 76 98.1 F (36.7 C) (Oral) 10 97%  Intake/Output Summary (Last 24 hours) at 10/25/14 4888 Last data filed at 10/25/14 0900  Gross per 24 hour  Intake 4513.33 ml  Output   1620 ml  Net 2893.33 ml   2+ PT pulse right foot, biphasic DP doppler, toes pink with good cap refill  Assessment/Planning: Viable foot with adequate perfusion.  Will sign off.  Call if questions.  Ruta Hinds 10/25/2014 9:22 AM --  Laboratory Lab Results:  Recent Labs  10/24/14 1829 10/25/14 0221  WBC 7.5 7.6  HGB 12.8* 11.3*  HCT 37.2* 33.3*  PLT 374 179   BMET  Recent Labs  10/24/14 1829 10/25/14 0221  NA 140 138  K 4.1 4.1  CL 109 109  CO2 19* 23  GLUCOSE 117* 144*  BUN 23* 18  CREATININE 1.38* 1.14  CALCIUM 8.5* 8.0*    COAG Lab Results  Component Value Date   INR 1.05 10/24/2014   No results found for: PTT
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
## 2014-10-25 NOTE — Progress Notes (Signed)
PT Cancellation Note  Patient Details Name: Richard [name] MRN: [id number] DOB: [birth year]   Cancelled Treatment:    Reason Eval/Treat Not Completed: Medical issues which prohibited therapy (Pt has bedrest order.  Please remove bedrest and clarify activity level if PT desired.  Thanks.)   Irwin Brakeman F 10/25/2014, 10:37 AM  [facility] [phone] (pager)
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
## 2014-10-26 LAB — TYPE AND SCREEN
ABO/RH(D): O POS
ANTIBODY SCREEN: NEGATIVE
Unit division: 0
Unit division: 0

## 2014-10-26 MED ORDER — OXYCODONE HCL 5 MG PO TABS
10.0000 mg | ORAL_TABLET | ORAL | Status: DC | PRN
  Administered 2014-10-26 – 2014-10-28 (×9): 10 mg via ORAL
  Administered 2014-10-29 (×2): 15 mg via ORAL
  Administered 2014-10-29: 10 mg via ORAL
  Administered 2014-10-30: 15 mg via ORAL
  Administered 2014-10-30: 10 mg via ORAL
  Administered 2014-10-30 – 2014-10-31 (×4): 15 mg via ORAL
  Filled 2014-10-26: qty 2
  Filled 2014-10-26: qty 3
  Filled 2014-10-26 (×2): qty 2
  Filled 2014-10-26: qty 3
  Filled 2014-10-26 (×2): qty 2
  Filled 2014-10-26: qty 3
  Filled 2014-10-26 (×3): qty 2
  Filled 2014-10-26 (×3): qty 3
  Filled 2014-10-26 (×2): qty 2
  Filled 2014-10-26: qty 3
  Filled 2014-10-26: qty 2
  Filled 2014-10-26: qty 3

## 2014-10-26 MED ORDER — SODIUM CHLORIDE 0.9 % IV SOLN
INTRAVENOUS | Status: DC
  Administered 2014-10-26 – 2014-10-29 (×3): via INTRAVENOUS

## 2014-10-26 MED ORDER — TRAMADOL HCL 50 MG PO TABS
100.0000 mg | ORAL_TABLET | Freq: Four times a day (QID) | ORAL | Status: DC
  Administered 2014-10-26 – 2014-10-31 (×18): 100 mg via ORAL
  Filled 2014-10-26 (×20): qty 2

## 2014-10-26 MED ORDER — HYDROMORPHONE HCL 1 MG/ML IJ SOLN
1.0000 mg | INTRAMUSCULAR | Status: DC | PRN
  Administered 2014-10-26 – 2014-10-27 (×5): 2 mg via INTRAVENOUS
  Administered 2014-10-27: 1 mg via INTRAVENOUS
  Administered 2014-10-28: 2 mg via INTRAVENOUS
  Administered 2014-10-28 (×2): 1 mg via INTRAVENOUS
  Administered 2014-10-28: 2 mg via INTRAVENOUS
  Filled 2014-10-26 (×2): qty 2
  Filled 2014-10-26 (×3): qty 1
  Filled 2014-10-26 (×5): qty 2
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
## 2014-10-26 NOTE — Progress Notes (Signed)
Wound vac not functioning-"blockage". Machine changed. Still not working. Ortho PA notified and new wound vac supplies at bedside.
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
## 2014-10-27 ENCOUNTER — Encounter (HOSPITAL_COMMUNITY): Payer: Self-pay | Admitting: Orthopedic Surgery

## 2014-10-27 LAB — BLOOD PRODUCT ORDER (VERBAL) VERIFICATION

## 2014-10-27 MED ORDER — ACETAMINOPHEN 500 MG PO TABS: 1000.0000 mg | ORAL_TABLET | ORAL | Status: DC

## 2014-10-27 MED ORDER — CHLORHEXIDINE GLUCONATE 4 % EX LIQD
60.0000 mL | Freq: Once | CUTANEOUS | Status: DC
  Filled 2014-10-27: qty 60

## 2014-10-27 MED ORDER — CHLORHEXIDINE GLUCONATE 4 % EX LIQD
60.0000 mL | Freq: Once | CUTANEOUS | Status: AC
  Administered 2014-10-28: 4 via TOPICAL
  Filled 2014-10-27 (×2): qty 60

## 2014-10-27 MED ORDER — POTASSIUM CHLORIDE IN NACL 20-0.45 MEQ/L-% IV SOLN
INTRAVENOUS | Status: DC
  Administered 2014-10-28: 05:00:00 via INTRAVENOUS
  Filled 2014-10-27 (×3): qty 1000

## 2014-10-27 MED ORDER — CEFAZOLIN SODIUM-DEXTROSE 2-3 GM-% IV SOLR
2.0000 g | INTRAVENOUS | Status: AC
  Administered 2014-10-28: 2 g via INTRAVENOUS
  Administered 2014-10-28: 1 g via INTRAVENOUS
  Filled 2014-10-27 (×2): qty 50
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
## 2014-10-27 NOTE — Progress Notes (Signed)
OT Cancellation Note  Patient Details Name: Richard [name] MRN: [id number] DOB: 09/19/1950   Cancelled Treatment:    Reason Eval/Treat Not Completed: Pain limiting ability to participate;Other (comment). Pr reports 9/10 R foot pain and nausea. Pt requested OT return later  Britt Bottom 10/27/2014, 10:00 AM
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
## 2014-10-28 ENCOUNTER — Encounter (HOSPITAL_COMMUNITY): Payer: Self-pay | Admitting: Anesthesiology

## 2014-10-28 ENCOUNTER — Inpatient Hospital Stay (HOSPITAL_COMMUNITY): Payer: BLUE CROSS/BLUE SHIELD

## 2014-10-28 ENCOUNTER — Encounter (HOSPITAL_COMMUNITY)
Admission: EM | Disposition: A | Discharge status: Skilled Nursing Facility | Payer: Self-pay | Source: Home / Self Care | Attending: Orthopedic Surgery

## 2014-10-28 ENCOUNTER — Inpatient Hospital Stay (HOSPITAL_COMMUNITY): Payer: BLUE CROSS/BLUE SHIELD | Admitting: Anesthesiology

## 2014-10-28 HISTORY — PX: ORIF CLAVICULAR FRACTURE: SHX5055

## 2014-10-28 HISTORY — PX: ORIF ANKLE FRACTURE: SHX5408

## 2014-10-28 LAB — CBC
HCT: 23.3 % — ABNORMAL LOW (ref 39.0–52.0)
HEMOGLOBIN: 8 g/dL — AB (ref 13.0–17.0)
MCH: 30.7 pg (ref 26.0–34.0)
MCHC: 34.3 g/dL (ref 30.0–36.0)
MCV: 89.3 fL (ref 78.0–100.0)
Platelets: 163 10*3/uL (ref 150–400)
RBC: 2.61 MIL/uL — AB (ref 4.22–5.81)
RDW: 12.8 % (ref 11.5–15.5)
WBC: 5 10*3/uL (ref 4.0–10.5)

## 2014-10-28 SURGERY — OPEN REDUCTION INTERNAL FIXATION (ORIF) ANKLE FRACTURE
Anesthesia: General | Site: Ankle | Laterality: Right

## 2014-10-28 MED ORDER — PHENYLEPHRINE HCL 10 MG/ML IJ SOLN
INTRAMUSCULAR | Status: DC | PRN
  Administered 2014-10-28 (×6): 80 ug via INTRAVENOUS

## 2014-10-28 MED ORDER — ALBUTEROL SULFATE HFA 108 (90 BASE) MCG/ACT IN AERS
INHALATION_SPRAY | RESPIRATORY_TRACT | Status: DC | PRN
  Administered 2014-10-28: 2 via RESPIRATORY_TRACT

## 2014-10-28 MED ORDER — CEFAZOLIN SODIUM 1-5 GM-% IV SOLN
1.0000 g | Freq: Four times a day (QID) | INTRAVENOUS | Status: AC
  Administered 2014-10-28 – 2014-10-29 (×3): 1 g via INTRAVENOUS
  Filled 2014-10-28 (×3): qty 50

## 2014-10-28 MED ORDER — FENTANYL CITRATE (PF) 100 MCG/2ML IJ SOLN
100.0000 ug | Freq: Once | INTRAMUSCULAR | Status: AC
  Administered 2014-10-28: 100 ug via INTRAVENOUS

## 2014-10-28 MED ORDER — 0.9 % SODIUM CHLORIDE (POUR BTL) OPTIME
TOPICAL | Status: DC | PRN
  Administered 2014-10-28: 1000 mL

## 2014-10-28 MED ORDER — FENTANYL CITRATE (PF) 100 MCG/2ML IJ SOLN
INTRAMUSCULAR | Status: DC | PRN
  Administered 2014-10-28 (×2): 50 ug via INTRAVENOUS

## 2014-10-28 MED ORDER — KETOROLAC TROMETHAMINE 0.5 % OP SOLN
1.0000 [drp] | Freq: Four times a day (QID) | OPHTHALMIC | Status: DC
  Filled 2014-10-28: qty 3

## 2014-10-28 MED ORDER — PHENYLEPHRINE HCL 10 MG/ML IJ SOLN
10.0000 mg | INTRAVENOUS | Status: DC | PRN
  Administered 2014-10-28: 30 ug/min via INTRAVENOUS

## 2014-10-28 MED ORDER — NEOSTIGMINE METHYLSULFATE 10 MG/10ML IV SOLN
INTRAVENOUS | Status: DC | PRN
  Administered 2014-10-28: 2 mg via INTRAVENOUS

## 2014-10-28 MED ORDER — FENTANYL CITRATE (PF) 100 MCG/2ML IJ SOLN
INTRAMUSCULAR | Status: AC
  Administered 2014-10-28: 100 ug via INTRAVENOUS
  Filled 2014-10-28: qty 2

## 2014-10-28 MED ORDER — ENOXAPARIN SODIUM 40 MG/0.4ML ~~LOC~~ SOLN: 40.0000 mg | SUBCUTANEOUS | Status: DC

## 2014-10-28 MED ORDER — LIDOCAINE HCL (CARDIAC) 20 MG/ML IV SOLN
INTRAVENOUS | Status: DC | PRN
  Administered 2014-10-28: 100 mg via INTRAVENOUS

## 2014-10-28 MED ORDER — ONDANSETRON HCL 4 MG PO TABS: 4.0000 mg | ORAL_TABLET | Freq: Three times a day (TID) | ORAL | Status: DC | PRN

## 2014-10-28 MED ORDER — LACTATED RINGERS IV SOLN
INTRAVENOUS | Status: DC | PRN
  Administered 2014-10-28 (×2): via INTRAVENOUS

## 2014-10-28 MED ORDER — ROCURONIUM BROMIDE 100 MG/10ML IV SOLN
INTRAVENOUS | Status: DC | PRN
  Administered 2014-10-28: 50 mg via INTRAVENOUS

## 2014-10-28 MED ORDER — CEFAZOLIN SODIUM-DEXTROSE 2-3 GM-% IV SOLR
INTRAVENOUS | Status: AC
  Filled 2014-10-28: qty 50

## 2014-10-28 MED ORDER — ONDANSETRON HCL 4 MG/2ML IJ SOLN
INTRAMUSCULAR | Status: DC | PRN
  Administered 2014-10-28: 4 mg via INTRAVENOUS

## 2014-10-28 MED ORDER — FENTANYL CITRATE (PF) 250 MCG/5ML IJ SOLN
INTRAMUSCULAR | Status: AC
Start: 2014-10-28 — End: 2014-10-28
  Filled 2014-10-28: qty 5

## 2014-10-28 MED ORDER — OXYCODONE-ACETAMINOPHEN 5-325 MG PO TABS: 1.0000 | ORAL_TABLET | ORAL | Status: DC | PRN

## 2014-10-28 MED ORDER — ARTIFICIAL TEARS OP OINT
TOPICAL_OINTMENT | OPHTHALMIC | Status: DC | PRN
  Administered 2014-10-28: 1 via OPHTHALMIC

## 2014-10-28 MED ORDER — RIVAROXABAN 10 MG PO TABS: 10.0000 mg | ORAL_TABLET | Freq: Every day | ORAL | Status: DC

## 2014-10-28 MED ORDER — POLYETHYLENE GLYCOL 3350 17 G PO PACK
17.0000 g | PACK | Freq: Every day | ORAL | Status: DC | PRN
  Administered 2014-10-29: 17 g via ORAL
  Filled 2014-10-28: qty 1

## 2014-10-28 MED ORDER — ALBUMIN HUMAN 5 % IV SOLN
INTRAVENOUS | Status: DC | PRN
  Administered 2014-10-28: 14:00:00 via INTRAVENOUS

## 2014-10-28 MED ORDER — DOCUSATE SODIUM 100 MG PO CAPS: 100.0000 mg | ORAL_CAPSULE | Freq: Two times a day (BID) | ORAL | Status: DC

## 2014-10-28 MED ORDER — DEXAMETHASONE SODIUM PHOSPHATE 4 MG/ML IJ SOLN
INTRAMUSCULAR | Status: DC | PRN
  Administered 2014-10-28: 8 mg via INTRAVENOUS

## 2014-10-28 MED ORDER — MIDAZOLAM HCL 2 MG/2ML IJ SOLN
INTRAMUSCULAR | Status: AC
  Administered 2014-10-28: 2 mg via INTRAVENOUS
  Filled 2014-10-28: qty 2

## 2014-10-28 MED ORDER — GLYCOPYRROLATE 0.2 MG/ML IJ SOLN
INTRAMUSCULAR | Status: DC | PRN
  Administered 2014-10-28: 0.2 mg via INTRAVENOUS
  Administered 2014-10-28: .3 mg via INTRAVENOUS
  Administered 2014-10-28: 0.2 mg via INTRAVENOUS

## 2014-10-28 MED ORDER — MIDAZOLAM HCL 2 MG/2ML IJ SOLN
INTRAMUSCULAR | Status: AC
  Filled 2014-10-28: qty 2

## 2014-10-28 MED ORDER — RIVAROXABAN 10 MG PO TABS
10.0000 mg | ORAL_TABLET | Freq: Every day | ORAL | Status: DC
  Administered 2014-10-28 – 2014-10-31 (×4): 10 mg via ORAL
  Filled 2014-10-28 (×5): qty 1

## 2014-10-28 MED ORDER — MIDAZOLAM HCL 5 MG/ML IJ SOLN: 2.0000 mg | Freq: Once | INTRAMUSCULAR | Status: DC

## 2014-10-28 MED ORDER — MIDAZOLAM HCL 5 MG/5ML IJ SOLN
INTRAMUSCULAR | Status: DC | PRN
  Administered 2014-10-28: 1 mg via INTRAVENOUS

## 2014-10-28 MED ORDER — LACTATED RINGERS IV SOLN
INTRAVENOUS | Status: DC
  Administered 2014-10-28: 11:00:00 via INTRAVENOUS

## 2014-10-28 MED ORDER — DEXAMETHASONE SODIUM PHOSPHATE 4 MG/ML IJ SOLN
INTRAMUSCULAR | Status: AC
  Filled 2014-10-28: qty 2

## 2014-10-28 MED ORDER — KETOROLAC TROMETHAMINE 0.5 % OP SOLN
1.0000 [drp] | Freq: Four times a day (QID) | OPHTHALMIC | Status: AC
  Administered 2014-10-28 – 2014-10-30 (×5): 1 [drp] via OPHTHALMIC
  Filled 2014-10-28: qty 3

## 2014-10-28 MED ORDER — MIDAZOLAM HCL 2 MG/2ML IJ SOLN
2.0000 mg | Freq: Once | INTRAMUSCULAR | Status: AC
  Administered 2014-10-28: 2 mg via INTRAVENOUS

## 2014-10-28 MED ORDER — SILVER SULFADIAZINE 1 % EX CREA
TOPICAL_CREAM | CUTANEOUS | Status: DC
  Filled 2014-10-28: qty 85

## 2014-10-28 MED ORDER — PROPOFOL 10 MG/ML IV BOLUS
INTRAVENOUS | Status: DC | PRN
  Administered 2014-10-28: 160 mg via INTRAVENOUS

## 2014-10-28 MED ORDER — POTASSIUM CHLORIDE IN NACL 20-0.45 MEQ/L-% IV SOLN
INTRAVENOUS | Status: DC
  Administered 2014-10-28 – 2014-10-29 (×2): via INTRAVENOUS
  Filled 2014-10-28: qty 1000

## 2014-10-28 MED ORDER — BUPIVACAINE-EPINEPHRINE (PF) 0.5% -1:200000 IJ SOLN
INTRAMUSCULAR | Status: DC | PRN
  Administered 2014-10-28 (×3): 20 mL via PERINEURAL

## 2014-10-28 SURGICAL SUPPLY — 108 items
BANDAGE ELASTIC 4 VELCRO ST LF (GAUZE/BANDAGES/DRESSINGS) ×6 IMPLANT
BANDAGE ELASTIC 6 VELCRO ST LF (GAUZE/BANDAGES/DRESSINGS) ×3 IMPLANT
BANDAGE ESMARK 6X9 LF (GAUZE/BANDAGES/DRESSINGS) IMPLANT
BIT DRILL 2.0 (BIT) ×3 IMPLANT
BIT DRILL 3.5X122MM AO FIT (BIT) ×3 IMPLANT
BIT DRILL AO GAMMA 4.2X130 (BIT) ×3 IMPLANT
BIT DRILL AO GAMMA 4.2X340 (BIT) ×3 IMPLANT
BIT DRILL Q COUPLING 4.5 (BIT) IMPLANT
BIT DRILL Q/COUPLING 1 (BIT) IMPLANT
BNDG ESMARK 6X9 LF (GAUZE/BANDAGES/DRESSINGS)
BNDG GAUZE ELAST 4 BULKY (GAUZE/BANDAGES/DRESSINGS) ×3 IMPLANT
CANISTER SUCTION 2500CC (MISCELLANEOUS) ×3 IMPLANT
CANISTER WOUND CARE 500ML ATS (WOUND CARE) ×3 IMPLANT
CLEANER TIP ELECTROSURG 2X2 (MISCELLANEOUS) ×3 IMPLANT
CLSR STERI-STRIP ANTIMIC 1/2X4 (GAUZE/BANDAGES/DRESSINGS) ×3 IMPLANT
CONNECTOR Y ATS VAC SYSTEM (MISCELLANEOUS) ×3 IMPLANT
COUNTERSINK (MISCELLANEOUS) ×3
COVER MAYO STAND STRL (DRAPES) ×3 IMPLANT
COVER SURGICAL LIGHT HANDLE (MISCELLANEOUS) ×3 IMPLANT
CUFF TOURNIQUET SINGLE 34IN LL (TOURNIQUET CUFF) IMPLANT
DRAPE C-ARM 42X72 X-RAY (DRAPES) ×3 IMPLANT
DRAPE C-ARMOR (DRAPES) ×3 IMPLANT
DRAPE IMP U-DRAPE 54X76 (DRAPES) ×6 IMPLANT
DRAPE ORTHO SPLIT 77X108 STRL (DRAPES)
DRAPE SURG ORHT 6 SPLT 77X108 (DRAPES) IMPLANT
DRAPE U-SHAPE 47X51 STRL (DRAPES) ×6 IMPLANT
DRILL 2.6X122MM WL AO SHAFT (BIT) ×3 IMPLANT
DRILL 2.6X220MM LONG AO (BIT) ×3 IMPLANT
DRILL OVER 2.7X220 (BIT) ×3 IMPLANT
DRSG ADAPTIC 3X8 NADH LF (GAUZE/BANDAGES/DRESSINGS) ×6 IMPLANT
DRSG EMULSION OIL 3X3 NADH (GAUZE/BANDAGES/DRESSINGS) ×3 IMPLANT
DRSG MEPILEX BORDER 4X8 (GAUZE/BANDAGES/DRESSINGS) ×3 IMPLANT
DRSG PAD ABDOMINAL 8X10 ST (GAUZE/BANDAGES/DRESSINGS) ×6 IMPLANT
DRSG VAC ATS MED SENSATRAC (GAUZE/BANDAGES/DRESSINGS) ×3 IMPLANT
DURAPREP 26ML APPLICATOR (WOUND CARE) ×3 IMPLANT
ELECT REM PT RETURN 9FT ADLT (ELECTROSURGICAL) ×3
ELECTRODE REM PT RTRN 9FT ADLT (ELECTROSURGICAL) ×2 IMPLANT
GAUZE SPONGE 4X4 12PLY STRL (GAUZE/BANDAGES/DRESSINGS) ×3 IMPLANT
GLOVE BIO SURGEON STRL SZ7 (GLOVE) ×3 IMPLANT
GLOVE BIO SURGEON STRL SZ7.5 (GLOVE) ×6 IMPLANT
GLOVE BIO SURGEON STRL SZ8 (GLOVE) ×3 IMPLANT
GLOVE BIOGEL PI IND STRL 7.0 (GLOVE) ×2 IMPLANT
GLOVE BIOGEL PI IND STRL 8 (GLOVE) ×2 IMPLANT
GLOVE BIOGEL PI INDICATOR 7.0 (GLOVE) ×1
GLOVE BIOGEL PI INDICATOR 8 (GLOVE) ×1
GLOVE BIOGEL PI ORTHO PRO SZ8 (GLOVE)
GLOVE PI ORTHO PRO STRL SZ8 (GLOVE) IMPLANT
GOWN STRL REUS W/ TWL LRG LVL3 (GOWN DISPOSABLE) ×4 IMPLANT
GOWN STRL REUS W/ TWL XL LVL3 (GOWN DISPOSABLE) ×2 IMPLANT
GOWN STRL REUS W/TWL 2XL LVL3 (GOWN DISPOSABLE) IMPLANT
GOWN STRL REUS W/TWL LRG LVL3 (GOWN DISPOSABLE) ×2
GOWN STRL REUS W/TWL XL LVL3 (GOWN DISPOSABLE) ×1
GUIDEWIRE GAMMA (WIRE) ×3 IMPLANT
GUIDEWIRE GAMMA 800 (WIRE) ×3 IMPLANT
KIT BASIN OR (CUSTOM PROCEDURE TRAY) ×3 IMPLANT
KIT ROOM TURNOVER OR (KITS) ×3 IMPLANT
MANIFOLD NEPTUNE II (INSTRUMENTS) ×3 IMPLANT
NAIL IM TIBIAL T2 10X34.5 (Nail) ×2 IMPLANT
NAIL TIBIAL 10X34.5 (Nail) ×3 IMPLANT
NEEDLE 22X1 1/2 (OR ONLY) (NEEDLE) ×3 IMPLANT
NEEDLE HYPO 25GX1X1/2 BEV (NEEDLE) ×3 IMPLANT
NS IRRIG 1000ML POUR BTL (IV SOLUTION) ×3 IMPLANT
PACK ORTHO EXTREMITY (CUSTOM PROCEDURE TRAY) ×3 IMPLANT
PACK SHOULDER (CUSTOM PROCEDURE TRAY) ×3 IMPLANT
PACK UNIVERSAL I (CUSTOM PROCEDURE TRAY) ×3 IMPLANT
PAD ABD 8X10 STRL (GAUZE/BANDAGES/DRESSINGS) ×3 IMPLANT
PAD ARMBOARD 7.5X6 YLW CONV (MISCELLANEOUS) ×6 IMPLANT
PAD CAST 4YDX4 CTTN HI CHSV (CAST SUPPLIES) ×4 IMPLANT
PAD NEG PRESSURE SENSATRAC (MISCELLANEOUS) ×3 IMPLANT
PADDING CAST COTTON 4X4 STRL (CAST SUPPLIES) ×2
PENCIL BUTTON HOLSTER BLD 10FT (ELECTRODE) ×3 IMPLANT
PLATE 7H 96MM (Plate) ×3 IMPLANT
PLATE MIDSHAFT 8H SUPER CVD (Plate) ×3 IMPLANT
REAMER SHAFT BIXCUT (INSTRUMENTS) ×3 IMPLANT
SCREW BONE 14MMX3.5MM (Screw) ×12 IMPLANT
SCREW BONE 3.5X14MM (Screw) ×9 IMPLANT
SCREW BONE 3.5X16MM (Screw) ×12 IMPLANT
SCREW BONE 3.5X20MM (Screw) ×3 IMPLANT
SCREW CORTICAL 2.7X14MM (Screw) ×3 IMPLANT
SCREW COUNTERSINK (MISCELLANEOUS) ×2 IMPLANT
SCREW LOCKING 12X3.5MM (Screw) ×3 IMPLANT
SCREW LOCKING T2 F/T  5MMX40MM (Screw) ×2 IMPLANT
SCREW LOCKING T2 F/T  5X42.5MM (Screw) ×1 IMPLANT
SCREW LOCKING T2 F/T 5MMX40MM (Screw) ×4 IMPLANT
SCREW LOCKING T2 F/T 5X42.5MM (Screw) ×2 IMPLANT
SCREW LOCKING THREADED 5X47.5 (Screw) ×6 IMPLANT
SLING ARM LRG ADULT FOAM STRAP (SOFTGOODS) ×3 IMPLANT
SPONGE GAUZE 4X4 12PLY STER LF (GAUZE/BANDAGES/DRESSINGS) ×3 IMPLANT
SPONGE LAP 18X18 X RAY DECT (DISPOSABLE) ×3 IMPLANT
SPONGE LAP 4X18 X RAY DECT (DISPOSABLE) ×6 IMPLANT
STRIP CLOSURE SKIN 1/2X4 (GAUZE/BANDAGES/DRESSINGS) ×6 IMPLANT
SUCTION FRAZIER TIP 10 FR DISP (SUCTIONS) ×3 IMPLANT
SUT ETHILON 3 0 PS 1 (SUTURE) ×15 IMPLANT
SUT MNCRL AB 4-0 PS2 18 (SUTURE) ×6 IMPLANT
SUT MON AB 2-0 CT1 27 (SUTURE) ×3 IMPLANT
SUT MON AB 2-0 CT1 36 (SUTURE) ×3 IMPLANT
SUT VIC AB 0 CT1 27 (SUTURE)
SUT VIC AB 0 CT1 27XBRD ANBCTR (SUTURE) IMPLANT
SUT VICRYL 0 CT 1 36IN (SUTURE) ×3 IMPLANT
SYR BULB IRRIGATION 50ML (SYRINGE) ×3 IMPLANT
SYR CONTROL 10ML LL (SYRINGE) ×3 IMPLANT
TOWEL OR 17X24 6PK STRL BLUE (TOWEL DISPOSABLE) ×3 IMPLANT
TOWEL OR 17X26 10 PK STRL BLUE (TOWEL DISPOSABLE) ×3 IMPLANT
TOWEL OR NON WOVEN STRL DISP B (DISPOSABLE) ×3 IMPLANT
TUBE CONNECTING 12X1/4 (SUCTIONS) ×6 IMPLANT
UNDERPAD 30X30 INCONTINENT (UNDERPADS AND DIAPERS) ×3 IMPLANT
WATER STERILE IRR 1000ML POUR (IV SOLUTION) ×3 IMPLANT
YANKAUER SUCT BULB TIP NO VENT (SUCTIONS) ×3 IMPLANT

## 2014-10-28 NOTE — Progress Notes (Signed)
OT Cancellation Note  Patient Details Name: Richard [name] MRN: [id number] DOB: 06/09/1950   Cancelled Treatment:    Reason Eval/Treat Not Completed: Patient at procedure or test/ unavailable. Pt in surgery. Will re-attempt OT treatment as time and activity orders allow.   Maxxon Schwanke , MS, OTR/L, CLT Pager: [phone]  10/28/2014, 3:22 PM

## 2014-10-28 NOTE — Progress Notes (Signed)
Patient says he just had another surgery today and it will be better to talk to his sister about his home health discharge needs.  Reports he will provide his sister's name and number tomorrow  10/29/14.

## 2014-10-28 NOTE — Progress Notes (Signed)
Utilization review completed. Hyde Sires, RN, BSN. 

## 2014-10-28 NOTE — Progress Notes (Signed)
Pt c/o right eye "feels scratchy". It appears sl. Reddened. Dr Linna Caprice notified-new order for Ketoralac eye drops.

## 2014-10-28 NOTE — Progress Notes (Signed)
Orthopedic Tech Progress Note Patient Details:  Richard [name] Jan 19, 1951 [id number] Delivered CAM walker to pt.'s nurse. Ortho Devices Type of Ortho Device: CAM walker Splint Material: Other (comment) Ortho Device/Splint Location: RLE Ortho Device/Splint Interventions: Application   Darrol Poke 10/28/2014, 2:24 PM
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
## 2014-10-28 NOTE — Anesthesia Preprocedure Evaluation (Signed)
Anesthesia Evaluation  Patient identified by MRN, date of birth, ID band Patient awake    Reviewed: Allergy & Precautions, NPO status , Patient's Chart, lab work & pertinent test results  History of Anesthesia Complications Negative for: history of anesthetic complications  Airway Mallampati: II  TM Distance: >3 FB Neck ROM: Full    Dental  (+) Teeth Intact, Dental Advisory Given, Poor Dentition   Pulmonary former smoker,  breath sounds clear to auscultation        Cardiovascular negative cardio ROS  Rhythm:Regular Rate:Normal     Neuro/Psych negative neurological ROS     GI/Hepatic negative GI ROS, (+)     substance abuse  alcohol use,   Endo/Other  negative endocrine ROS  Renal/GU negative Renal ROS  negative genitourinary   Musculoskeletal negative musculoskeletal ROS (+)   Abdominal   Peds  Hematology negative hematology ROS (+)   Anesthesia Other Findings   Reproductive/Obstetrics                             Anesthesia Physical  Anesthesia Plan  ASA: II and emergent  Anesthesia Plan: General   Post-op Pain Management:    Induction: Intravenous  Airway Management Planned: Oral ETT  Additional Equipment:   Intra-op Plan:   Post-operative Plan: Extubation in OR  Informed Consent: I have reviewed the patients History and Physical, chart, labs and discussed the procedure including the risks, benefits and alternatives for the proposed anesthesia with the patient or authorized representative who has indicated his/her understanding and acceptance.   Dental advisory given  Plan Discussed with: CRNA, Anesthesiologist and Surgeon  Anesthesia Plan Comments:         Anesthesia Quick Evaluation

## 2014-10-28 NOTE — Progress Notes (Signed)
CRNA at bedside preparing to go back to OR.

## 2014-10-28 NOTE — Transfer of Care (Signed)
Immediate Anesthesia Transfer of Care Note  Patient: Richard [name]  Procedure(s) Performed: Procedure(s): OPEN REDUCTION INTERNAL FIXATION (ORIF) PILON (Right) OPEN REDUCTION INTERNAL FIXATION (ORIF) CLAVICULAR FRACTURE (Right)  Patient Location: PACU  Anesthesia Type:General  Level of Consciousness: awake, alert  and oriented  Airway & Oxygen Therapy: Patient Spontanous Breathing and Patient connected to nasal cannula oxygen  Post-op Assessment: Report given to RN and Post -op Vital signs reviewed and stable  Post vital signs: Reviewed and stable  Last Vitals:  Filed Vitals:   10/28/14 1140  BP: 104/60  Pulse: 89  Temp:   Resp: 13    Complications: No apparent anesthesia complications
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
## 2014-10-29 ENCOUNTER — Encounter (HOSPITAL_COMMUNITY): Payer: Self-pay | Admitting: Orthopedic Surgery

## 2014-10-29 LAB — CBC
HEMATOCRIT: 20 % — AB (ref 39.0–52.0)
HEMATOCRIT: 25.1 % — AB (ref 39.0–52.0)
Hemoglobin: 7 g/dL — ABNORMAL LOW (ref 13.0–17.0)
Hemoglobin: 8.7 g/dL — ABNORMAL LOW (ref 13.0–17.0)
MCH: 31 pg (ref 26.0–34.0)
MCH: 30.9 pg (ref 26.0–34.0)
MCHC: 35 g/dL (ref 30.0–36.0)
MCHC: 34.7 g/dL (ref 30.0–36.0)
MCV: 88.5 fL (ref 78.0–100.0)
MCV: 89 fL (ref 78.0–100.0)
Platelets: 190 10*3/uL (ref 150–400)
Platelets: 196 10*3/uL (ref 150–400)
RBC: 2.26 MIL/uL — ABNORMAL LOW (ref 4.22–5.81)
RBC: 2.82 MIL/uL — ABNORMAL LOW (ref 4.22–5.81)
RDW: 12.9 % (ref 11.5–15.5)
RDW: 13 % (ref 11.5–15.5)
WBC: 4.7 10*3/uL (ref 4.0–10.5)
WBC: 5.3 10*3/uL (ref 4.0–10.5)

## 2014-10-29 LAB — PREPARE RBC (CROSSMATCH)

## 2014-10-29 MED ORDER — SODIUM CHLORIDE 0.9 % IV SOLN
Freq: Once | INTRAVENOUS | Status: AC
  Administered 2014-10-29: 11:00:00 via INTRAVENOUS
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
## 2014-10-29 NOTE — Progress Notes (Signed)
OT Cancellation Note  Patient Details Name: Richard [name] MRN: [id number] DOB: 1951/03/15   Cancelled Treatment:    Reason Eval/Treat Not Completed: Medical issues which prohibited therapy. Patient's hgb 7.0. Will hold therapy for now. According to PT note, RN and MD recommended holding therapy today. Plan to follow-up for OT eval/treat as able.   Tajuana Kniskern , MS, OTR/L, CLT Pager: [phone]  10/29/2014, 12:23 PM

## 2014-10-29 NOTE — Clinical Documentation Improvement (Signed)
Supporting Information:  Patient's hemoglobin dropped from 8 to 7 today after surgery. He is feeling pretty lethargic. We will transfuse 2 units today and recheck CBC. We will hold PT/OT getting the patient out of bed today until he receives blood and starts feeling better.   Labs: Component     Latest Ref Rng 10/24/2014 10/25/2014 10/28/2014 10/29/2014  Hemoglobin     13.0 - 17.0 g/dL 12.8 (L) 11.3 (L) 8.0 (L) 7.0 (L)  HCT     39.0 - 52.0 % 37.2 (L) 33.3 (L) 23.3 (L) 20.0 (L)    Treatment: Transfused 2 units PRBC & Monitoring CBC  After study, please clarify drop in H/H in progress note and discharge summary:  --Acute blood loss anemia --Acute on Chronic blood loss anemia --Chronic blood loss anemia --Other --Unable to determine  . Document any associated diagnoses/conditions  Thank You, Dessirae Scarola T. Pricilla Handler, MSN, MBA/MHA Clinical Documentation Specialist [email or web address] Office # [phone]
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
## 2014-10-29 NOTE — Discharge Instructions (Signed)
Keep foot in CAM boot at all times and Non-weight bearing in the R leg  OK to weight bear as tolerated in the R elbow in order to use a platform walker  Xarelto daily for 30 days for DVT prophylaxis  _______________________________________________________________________________________________________________________________________________  Information on my medicine - XARELTO (Rivaroxaban)  This medication education was reviewed with me or my healthcare representative as part of my discharge preparation.  The pharmacist that spoke with me during my hospital stay was:  Myrene Galas, [facility]  Why was Xarelto prescribed for you? Xarelto was prescribed for you to reduce the risk of blood clots forming after orthopedic surgery. The medical term for these abnormal blood clots is venous thromboembolism (VTE).  What do you need to know about xarelto ? Take your Xarelto ONCE DAILY at the same time every day. You may take it either with or without food.  If you have difficulty swallowing the tablet whole, you may crush it and mix in applesauce just prior to taking your dose.  Take Xarelto exactly as prescribed by your doctor and DO NOT stop taking Xarelto without talking to the doctor who prescribed the medication.  Stopping without other VTE prevention medication to take the place of Xarelto may increase your risk of developing a clot.  After discharge, you should have regular check-up appointments with your healthcare provider that is prescribing your Xarelto.    What do you do if you miss a dose? If you miss a dose, take it as soon as you remember on the same day then continue your regularly scheduled once daily regimen the next day. Do not take two doses of Xarelto on the same day.   Important Safety Information A possible side effect of Xarelto is bleeding. You should call your healthcare provider right away if you experience any of the following: ? Bleeding from an injury or  your nose that does not stop. ? Unusual colored urine (red or dark brown) or unusual colored stools (red or black). ? Unusual bruising for unknown reasons. ? A serious fall or if you hit your head (even if there is no bleeding).  Some medicines may interact with Xarelto and might increase your risk of bleeding while on Xarelto. To help avoid this, consult your healthcare provider or pharmacist prior to using any new prescription or non-prescription medications, including herbals, vitamins, non-steroidal anti-inflammatory drugs (NSAIDs) and supplements.  This website has more information on Xarelto: [email or web address]

## 2014-10-29 NOTE — Progress Notes (Signed)
PT Cancellation Note  Patient Details Name: Richard [name] MRN: [id number] DOB: May 25, 1950   Cancelled Treatment:    Reason Eval/Treat Not Completed: Medical issues which prohibited therapy (per RN and MD hold PT today 2/2 low Hgb).  PT will continue to follow acutely.    Joslyn Hy PT, DPT [phone] Pager: [phone] 10/29/2014, 9:16 AM

## 2014-10-30 DIAGNOSIS — D62 Acute posthemorrhagic anemia: Secondary | ICD-10-CM

## 2014-10-30 DIAGNOSIS — S42001A Fracture of unspecified part of right clavicle, initial encounter for closed fracture: Secondary | ICD-10-CM

## 2014-10-30 DIAGNOSIS — S82401A Unspecified fracture of shaft of right fibula, initial encounter for closed fracture: Secondary | ICD-10-CM

## 2014-10-30 DIAGNOSIS — S82201A Unspecified fracture of shaft of right tibia, initial encounter for closed fracture: Secondary | ICD-10-CM

## 2014-10-30 LAB — CBC
HEMATOCRIT: 24.6 % — AB (ref 39.0–52.0)
Hemoglobin: 8.5 g/dL — ABNORMAL LOW (ref 13.0–17.0)
MCH: 30.8 pg (ref 26.0–34.0)
MCHC: 34.6 g/dL (ref 30.0–36.0)
MCV: 89.1 fL (ref 78.0–100.0)
PLATELETS: 200 10*3/uL (ref 150–400)
RBC: 2.76 MIL/uL — AB (ref 4.22–5.81)
RDW: 13.2 % (ref 11.5–15.5)
WBC: 5.7 10*3/uL (ref 4.0–10.5)

## 2014-10-30 LAB — TYPE AND SCREEN
ABO/RH(D): O POS
Antibody Screen: NEGATIVE
UNIT DIVISION: 0
UNIT DIVISION: 0
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
## 2014-10-30 NOTE — Clinical Social Work Placement (Signed)
   CLINICAL SOCIAL WORK PLACEMENT  NOTE  Date:  10/30/2014  Patient Details  Name: Richard [name] MRN: [id number] Date of Birth: January 30, 1951  Clinical Social Work is seeking post-discharge placement for this patient at the [place] level of care (*CSW will initial, date and re-position this form in  chart as items are completed):  Yes   Patient/family provided with [facility] Work Department's list of facilities offering this level of care within the geographic area requested by the patient (or if unable, by the patient's family).  Yes   Patient/family informed of their freedom to choose among providers that offer the needed level of care, that participate in Medicare, Medicaid or managed care program needed by the patient, have an available bed and are willing to accept the patient.  Yes   Patient/family informed of [facility]'s ownership interest in [facility] and [facility], as well as of the fact that they are under no obligation to receive care at these facilities.  PASRR submitted to EDS on 10/30/14     PASRR number received on 10/30/14     Existing PASRR number confirmed on       FL2 transmitted to all facilities in geographic area requested by pt/family on 10/30/14     FL2 transmitted to all facilities within larger geographic area on       Patient informed that his/her managed care company has contracts with or will negotiate with certain facilities, including the following:        Yes   Patient/family informed of bed offers received.  Patient chooses bed at [facility]     Physician recommends and patient chooses bed at  (none)    Patient to be transferred to [facility] on 10/31/14.  Patient to be transferred to facility by [facility]     Patient family notified on   of transfer.  Name of family member notified:        PHYSICIAN Please prepare priority discharge summary, including  medications     Additional Comment:    _______________________________________________ Dulcy Fanny, LCSW 10/30/2014, 11:21 AM

## 2014-10-30 NOTE — Clinical Social Work Note (Signed)
Clinical Social Work Assessment  Patient Details  Name: Richard [name] MRN: [id number] Date of Birth: 05-07-1950  Date of referral:  10/30/14               Reason for consult:  Facility Placement                Permission sought to share information with:  Chartered certified accountant granted to share information::  Yes, Verbal Permission Granted  Name::        Agency::   ([facility])  Relationship::     Contact Information:     Housing/Transportation Living arrangements for the past 2 months:  [place] of Information:  Patient Patient Interpreter Needed:  None Criminal Activity/Legal Involvement Pertinent to Current Situation/Hospitalization:  No - Comment as needed Significant Relationships:  Siblings Lives with:  Self Do you feel safe going back to the place where you live?  No (fall risks) Need for family participation in patient care:  No (Coment)  Care giving concerns:  No caregiver present at time of assessment   Social Worker assessment / plan:  Patient states mother has been resident at [facility] and wishes to receive STR at that facility as well.  Liaison notified to begin insurance authorization.  Employment status:    Insurance informationEducational psychologist PT Recommendations:  [place] / Referral to community resources:  [place]  Patient/Family's Response to care:  Patient agreeable to SNF though wishes it were not needed.  CSW offered support.  Patient/Family's Understanding of and Emotional Response to Diagnosis, Current Treatment, and Prognosis:  Patient is realistic regarding prognosis and needed therapies  Emotional Assessment Appearance:  Appears stated age Attitude/Demeanor/Rapport:   (appropriate) Affect (typically observed):  Accepting, Adaptable Orientation:  Oriented to Self, Oriented to Place, Oriented to  Time, Oriented to  Situation Alcohol / Substance use:  Never Used Psych involvement (Current and /or in the community):  No (Comment)  Discharge Needs  Concerns to be addressed:  No discharge needs identified Readmission within the last 30 days:    Current discharge risk:  None Barriers to Discharge:  No Barriers Identified   Dulcy Fanny, LCSW 10/30/2014, 11:20 AM
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
## 2014-10-31 LAB — CBC
HCT: 27.6 % — ABNORMAL LOW (ref 39.0–52.0)
Hemoglobin: 9.4 g/dL — ABNORMAL LOW (ref 13.0–17.0)
MCH: 30.5 pg (ref 26.0–34.0)
MCHC: 34.1 g/dL (ref 30.0–36.0)
MCV: 89.6 fL (ref 78.0–100.0)
PLATELETS: 251 10*3/uL (ref 150–400)
RBC: 3.08 MIL/uL — ABNORMAL LOW (ref 4.22–5.81)
RDW: 13.1 % (ref 11.5–15.5)
WBC: 6.2 10*3/uL (ref 4.0–10.5)
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
## 2014-10-31 NOTE — Discharge Planning (Signed)
Patient will discharge today per MD order. Patient will discharge to [facility] in [place] to call report prior to transportation to: [phone] Transportation: [facility]- scheduled for 2:30pm  CSW sent discharge summary to SNF for review.  Packet is complete.  RN, patient and family aware of discharge plans.  Nonnie Done, [place] [phone]  Psychiatric [facility] Orthopedics (5N 1-16) Clinical Social Worker

## 2014-11-03 ENCOUNTER — Encounter (HOSPITAL_COMMUNITY): Payer: Self-pay | Admitting: Orthopedic Surgery

## 2014-11-06 NOTE — Anesthesia Postprocedure Evaluation (Signed)
  Anesthesia Post-op Note  Patient: Richard [name]  Procedure(s) Performed: Procedure(s): OPEN REDUCTION INTERNAL FIXATION (ORIF) PILON (Right) OPEN REDUCTION INTERNAL FIXATION (ORIF) CLAVICULAR FRACTURE (Right)  Patient Location: PACU  Anesthesia Type:General and GA combined with regional for post-op pain  Level of Consciousness: awake, alert  and oriented  Airway and Oxygen Therapy: Patient Spontanous Breathing and Patient connected to nasal cannula oxygen  Post-op Pain: none  Post-op Assessment: Post-op Vital signs reviewed, Patient's Cardiovascular Status Stable, Respiratory Function Stable, Patent Airway and Pain level controlled   LLE Sensation: Full sensation RLE Motor Response: Responds to commands, Purposeful movement RLE Sensation: Full sensation      Post-op Vital Signs: stable  Last Vitals:  Filed Vitals:   10/31/14 1803  BP: 122/78  Pulse: 80  Temp: 37.1 C  Resp: 16    Complications: No apparent anesthesia complications

## 2014-11-17 ENCOUNTER — Encounter: Payer: BLUE CROSS/BLUE SHIELD | Attending: Surgery | Admitting: Surgery

## 2014-11-17 DIAGNOSIS — S81811A Laceration without foreign body, right lower leg, initial encounter: Secondary | ICD-10-CM | POA: Insufficient documentation

## 2014-11-17 DIAGNOSIS — X58XXXA Exposure to other specified factors, initial encounter: Secondary | ICD-10-CM | POA: Insufficient documentation

## 2014-11-17 DIAGNOSIS — T8131XA Disruption of external operation (surgical) wound, not elsewhere classified, initial encounter: Secondary | ICD-10-CM | POA: Insufficient documentation

## 2014-11-17 NOTE — Progress Notes (Addendum)
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 Lidocaine 4% cream applied to wound bed prior to debridement Wound #2 Medial,Anterior Lower Leg: Topical Lidocaine 4% cream applied to wound bed prior to debridement Wound #3 Right,Lateral Lower Leg: Topical Lidocaine 4% cream applied to wound bed prior to debridement Wound #4 Right,Anterior Lower Leg: Topical Lidocaine 4% cream applied to wound bed prior to debridement Wound #5 Right,Distal,Anterior Lower Leg: Topical Lidocaine 4% cream applied to wound bed prior to debridement ABOU, STERKEL A. ([id number]) Primary Wound Dressing: Wound #1 Left,Proximal,Anterior Lower Leg: Aquacel Ag Wound #2 Medial,Anterior Lower Leg: Aquacel Ag Wound #3 Right,Lateral Lower Leg: Aquacel Ag Wound #4 Right,Anterior Lower Leg: Aquacel Ag Wound #5 Right,Distal,Anterior Lower Leg: Aquacel Ag Secondary Dressing: Wound #1 Left,Proximal,Anterior Lower Leg: ABD and  Kerlix/Conform Wound #2 Medial,Anterior Lower Leg: ABD and Kerlix/Conform Wound #3 Right,Lateral Lower Leg: ABD and Kerlix/Conform Wound #4 Right,Anterior Lower Leg: ABD and Kerlix/Conform Wound #5 Right,Distal,Anterior Lower Leg: ABD and Kerlix/Conform Dressing Change Frequency: Wound #1 Left,Proximal,Anterior Lower Leg: Change dressing every other day. Wound #2 Medial,Anterior Lower Leg: Change dressing every other day. Wound #3 Right,Lateral Lower Leg: Change dressing every other day. Wound #4 Right,Anterior Lower Leg: Change dressing every other day. Wound #5 Right,Distal,Anterior Lower Leg: Change dressing every other day. Follow-up Appointments: Wound #1 Left,Proximal,Anterior Lower Leg: Return Appointment in 1 week. Wound #  2 Medial,Anterior Lower Leg: Return Appointment in 1 week. Wound #3 Right,Lateral Lower Leg: Return Appointment in 1 week. Wound #4 Right,Anterior Lower Leg: Return Appointment in 1 week. Wound #5 Right,Distal,Anterior Lower Leg: Return Appointment in 1 week. Edema Control: Wound #1 Left,Proximal,Anterior Lower Leg: Tubigrip Wound #2 Medial,Anterior Lower Leg: Tubigrip Wound #3 Right,Lateral Lower Leg: Tubigrip NAVID, LENZEN A. ([id number]) Wound #4 Right,Anterior Lower Leg: Tubigrip Wound #5 Right,Distal,Anterior Lower Leg: Tubigrip I have recommended stopping the medihoney and application of silver alginate over the lacerated wounds. he can do the dressing on alternate days and I have no objections if he can take a shower. He will see me on a weekly basis. All questions answered and he will be compliant. Electronic Signature(s) Signed: 11/17/2014 11:53:55 AM By: Christin Fudge MD, FACS Entered By: Christin Fudge on 11/17/2014 11:53:54 [name] ([id number]) -------------------------------------------------------------------------------- ROS/PFSH Details Patient Name: Richard Hidden A. Date of Service: 11/17/2014 10:15 AM Medical  Record Number: [id number] Patient Account Number: [id number] Date of Birth/Sex: 1951/02/04 (64 y.o. Male) Treating RN: Baruch Gouty, RN, BSN, Velva Harman Primary Care Physician: Bluford Kaufmann Other Clinician: Referring Physician: Edmonia Lynch Treating Physician/Extender: Frann Rider in Treatment: 0 Information Obtained From Patient Wound History Do you currently have one or more open woundso Yes How many open wounds do you currently haveo 3 Approximately how long have you had your woundso 3weeks How have you been treating your wound(s) until nowo medihoney Has your wound(s) ever healed and then re-openedo No Have you had any lab work done in the past montho Yes Have you tested positive for an antibiotic resistant organism (MRSA, VRE)o No Have you tested positive for osteomyelitis (bone infection)o No Have you had any tests for circulation on your legso No Have you had other problems associated with your woundso Infection Hematologic/Lymphatic Complaints and Symptoms: Positive for: Bleeding / Clotting Disorders Medical History: Negative for: Anemia; Hemophilia; Human Immunodeficiency Virus; Lymphedema; Sickle Cell Disease Integumentary (Skin) Complaints and Symptoms: Positive for: Wounds; Breakdown; Swelling Medical History: Negative for: History of Burn; History of pressure wounds Musculoskeletal Complaints and Symptoms: Positive for: Muscle Weakness Medical History: Negative for: Gout; Rheumatoid Arthritis; Osteoarthritis; Osteomyelitis Psychiatric Complaints and Symptoms: No Complaints or Symptoms Pollino, Vishal A. ([id number]) Complaints and Symptoms: Negative for: Anxiety Constitutional Symptoms (General Health) Complaints and Symptoms: No Complaints or Symptoms Eyes Complaints and Symptoms: No Complaints or Symptoms Medical History: Negative for: Cataracts; Glaucoma; Optic Neuritis Ear/Nose/Mouth/Throat Complaints and Symptoms: No Complaints or  Symptoms Medical History: Negative for: Chronic sinus problems/congestion; Middle ear problems Respiratory Complaints and Symptoms: No Complaints or Symptoms Medical History: Negative for: Aspiration; Asthma; Chronic Obstructive Pulmonary Disease (COPD); Pneumothorax; Sleep Apnea; Tuberculosis Cardiovascular Complaints and Symptoms: No Complaints or Symptoms Medical History: Negative for: Angina; Arrhythmia; Congestive Heart Failure; Coronary Artery Disease; Deep Vein Thrombosis; Hypertension; Hypotension; Myocardial Infarction; Peripheral Arterial Disease; Peripheral Venous Disease; Phlebitis; Vasculitis Gastrointestinal Complaints and Symptoms: No Complaints or Symptoms Medical History: Negative for: Cirrhosis ; Colitis; Crohnos; Hepatitis A; Hepatitis B; Hepatitis C Lowrimore, Nathanal A. ([id number]) Endocrine Complaints and Symptoms: No Complaints or Symptoms Medical History: Negative for: Type I Diabetes Genitourinary Complaints and Symptoms: No Complaints or Symptoms Medical History: Negative for: End Stage Renal Disease Immunological Complaints and Symptoms: No Complaints or Symptoms Medical History: Negative for: Lupus Erythematosus; Raynaudos; Scleroderma Neurologic Complaints and Symptoms: No Complaints or Symptoms Medical History: Positive for: Neuropathy Negative for: Dementia; Quadriplegia; Paraplegia; Seizure Disorder Oncologic Complaints and Symptoms: No Complaints or Symptoms Hospitalization / Surgery History Name of Hospital  Purpose of Hospitalization/Surgery Date [facility] [place] [facility] 10/24/2014 Family and Social History Cancer: No; Diabetes: No; Heart Disease: Yes - Father; Hereditary Spherocytosis: No; Hypertension: No; Kidney Disease: No; Lung Disease: No; Seizures: No; Stroke: No; Thyroid Problems: No; Tuberculosis: No; Former smoker - quit 7years ago; Marital Status - Divorced; Alcohol Use: Rarely; Drug Use: No History; Caffeine Use: Daily -  coffee, coke, tea; Financial Concerns: No; Food, Clothing or Shelter Needs: No; Support System Lacking: No; Transportation Concerns: No; Advanced Directives: No; Patient does not want information on Advanced Directives; Living Will: No Physician Affirmation I have reviewed and agree with the above information. GURLEY, CLIMER ([id number]) Electronic Signature(s) Signed: 11/17/2014 11:04:46 AM By: Christin Fudge MD, FACS Signed: 11/17/2014 3:44:26 PM By: Regan Lemming BSN, RN Previous Signature: 11/17/2014 10:44:50 AM Version By: Regan Lemming BSN, RN Entered By: Christin Fudge on 11/17/2014 11:04:44 [name] ([id number]) -------------------------------------------------------------------------------- SuperBill Details Patient Name: Richard Hidden A. Date of Service: 11/17/2014 Medical Record Number: [id number] Patient Account Number: [id number] Date of Birth/Sex: 1950-07-07 (64 y.o. Male) Treating RN: Primary Care Physician: Bluford Kaufmann Other Clinician: Referring Physician: Edmonia Lynch Treating Physician/Extender: Frann Rider in Treatment: 0 Diagnosis Coding ICD-10 Codes Code Description [phone] Laceration without foreign body, right lower leg, initial encounter Disruption of external operation (surgical) wound, not elsewhere classified, initial T81.31XA encounter Facility Procedures CPT4: Description Modifier Quantity Code [id number] WOUND CARE VISIT-LEV 3 NEW PT 1 CPT4: 05025615 11042 - DEB SUBQ TISSUE 20 SQ CM/< 1 ICD-10 Description Diagnosis S81.811A Laceration without foreign body, right lower leg, initial encounter T81.31XA Disruption of external operation (surgical) wound, not elsewhere classified, initial  encounter Physician Procedures CPT4: Description Modifier Quantity Code [id number] 99204 - WC PHYS LEVEL 4 - NEW PT 1 ICD-10 Description Diagnosis S81.811A Laceration without foreign body, right lower leg, initial encounter T81.31XA Disruption of external  operation (surgical) wound, not  elsewhere classified, initial encounter CPT4: 3448301 11042 - WC PHYS SUBQ TISS 20 SQ CM 1 ICD-10 Description Diagnosis S81.811A Laceration without foreign body, right lower leg, initial encounter T81.31XA Disruption of external operation (surgical) wound, not elsewhere classified, initial  encounter CAUY, MELODY ([id number]) Electronic Signature(s) Signed: 11/17/2014 3:44:26 PM By: Regan Lemming BSN, RN Previous Signature: 11/17/2014 11:54:10 AM Version By: Christin Fudge MD, FACS Entered By: Regan Lemming on 11/17/2014 15:20:50

## 2014-11-17 NOTE — Progress Notes (Addendum)
ZAVIOR, THOMASON ([id number]) Visit Report for 11/17/2014 Allergy List Details Patient Name: Richard [name], Richard [name]. Date of Service: 11/17/2014 10:15 AM Medical Record Number: [id number] Patient Account Number: [id number] Date of Birth/Sex: [birth year] (64 yHanseno. Male) Treating RN: Baruch Gouty, RN, BSN, Velva Harman Primary Care Physician: Bluford Kaufmann Other Clinician: Referring Physician: Edmonia Lynch Treating Physician/Extender: Frann Rider in Treatment: 0 Allergies Active Allergies no known allergies Allergy Notes Electronic Signature(s) Signed: 11/17/2014 10:39:43 AM By: Regan Lemming BSN, RN Entered By: Regan Lemming on 11/17/2014 10:39:42 Richard [name] ([id number]) -------------------------------------------------------------------------------- Arrival Information Details Patient Name: Richard [name]. Date of Service: 11/17/2014 10:15 AM Medical Record Number: [id number] Patient Account Number: [id number] Date of Birth/Sex: 02/07/1951 (64 yHanseno. Male) Treating RN: Baruch Gouty, RN, BSN, Velva Harman Primary Care Physician: Bluford Kaufmann Other Clinician: Referring Physician: Edmonia Lynch Treating Physician/Extender: Frann Rider in Treatment: 0 Visit Information Patient Arrived: Wheel Chair Arrival Time: 10:29 Accompanied By: self Transfer Assistance: None Patient Identification Verified: Yes Secondary Verification Process Yes Completed: Patient Requires Transmission- No Based Precautions: Patient Has Alerts: Yes Patient Alerts: Patient on Blood Thinner Xarelto Electronic Signature(s) Signed: 11/17/2014 10:31:17 AM By: Regan Lemming BSN, RN Entered By: Regan Lemming on 11/17/2014 10:31:16 Richard [name] ([id number]) -------------------------------------------------------------------------------- Clinic Level of Care Assessment Details Patient Name: Richard [name]. Date of Service: 11/17/2014 10:15 AM Medical Record Number: [id number] Patient Account Number: [id number] Date of  Birth/Sex: [birth year] (64 yHanseno. Male) Treating RN: Baruch Gouty, RN, BSN, Velva Harman Primary Care Physician: Bluford Kaufmann Other Clinician: Referring Physician: Edmonia Lynch Treating Physician/Extender: Frann Rider in Treatment: 0 Clinic Level of Care Assessment Items TOOL 1 Quantity Score []  - Use when EandM and Procedure is performed on INITIAL visit 0 ASSESSMENTS - Nursing Assessment / Reassessment X - General Physical Exam (combine w/ comprehensive assessment (listed just 1 20 below) when performed on new pt. evals) X - Comprehensive Assessment (HX, ROS, Risk Assessments, Wounds Hx, etc.) 1 25 ASSESSMENTS - Wound and Skin Assessment / Reassessment []  - Dermatologic / Skin Assessment (not related to wound area) 0 ASSESSMENTS - Ostomy and/or Continence Assessment and Care []  - Incontinence Assessment and Management 0 []  - Ostomy Care Assessment and Management (repouching, etc.) 0 PROCESS - Coordination of Care X - Simple Patient / Family Education for ongoing care 1 15 []  - Complex (extensive) Patient / Family Education for ongoing care 0 X - Staff obtains Consents, Records, Test Results / Process Orders 1 10 []  - Staff telephones HHA, Nursing Homes / Clarify orders / etc 0 []  - Routine Transfer to another Facility (non-emergent condition) 0 []  - Routine Hospital Admission (non-emergent condition) 0 []  - New Admissions / Biomedical engineer / Ordering NPWT, Apligraf, etc. 0 []  - Emergency Hospital Admission (emergent condition) 0 PROCESS - Special Needs []  - Pediatric / Minor Patient Management 0 []  - Isolation Patient Management 0 [name], Richard [name]. ([id number]) []  - Hearing / Language / Visual special needs 0 []  - Assessment of Community assistance (transportation, D/C planning, etc.) 0 []  - Additional assistance / Altered mentation 0 []  - Support Surface(s) Assessment (bed, cushion, seat, etc.) 0 INTERVENTIONS - Miscellaneous []  - External ear exam 0 []  - Patient  Transfer (multiple staff / Civil Service fast streamer / Similar devices) 0 []  - Simple Staple / Suture removal (25 or less) 0 []  - Complex Staple / Suture removal (26 or more) 0 []  - Hypo/Hyperglycemic Management (do not check if billed separately) 0 X - Ankle / Brachial Index (ABI) - do not check if billed  separately 1 15 Has the patient been seen at the hospital within the last three years: Yes Total Score: 85 Level Of Care: New/Established - Level 3 Electronic Signature(s) Signed: 11/17/2014 3:44:26 PM By: Regan Lemming BSN, RN Entered By: Regan Lemming on 11/17/2014 12:09:27 Richard [name] ([id number]) -------------------------------------------------------------------------------- Encounter Discharge Information Details Patient Name: Richard [name]. Date of Service: 11/17/2014 10:15 AM Medical Record Number: [id number] Patient Account Number: [id number] Date of Birth/Sex: 02-Oct-1950 (64 yHanseno. Male) Treating RN: Baruch Gouty, RN, BSN, Velva Harman Primary Care Physician: Bluford Kaufmann Other Clinician: Referring Physician: Edmonia Lynch Treating Physician/Extender: Frann Rider in Treatment: 0 Encounter Discharge Information Items Discharge Pain Level: 0 Discharge Condition: Stable Ambulatory Status: Wheelchair Nursing Discharge Destination: Home Transportation: Other Accompanied By: self Schedule Follow-up Appointment: No Medication Reconciliation completed No and provided to Patient/Care Neriyah Cercone: Clinical Summary of Care: Electronic Signature(s) Signed: 11/17/2014 3:44:26 PM By: Regan Lemming BSN, RN Entered By: Regan Lemming on 11/17/2014 12:10:47 Richard [name] ([id number]) -------------------------------------------------------------------------------- Lower Extremity Assessment Details Patient Name: Richard [name]. Date of Service: 11/17/2014 10:15 AM Medical Record Number: [id number] Patient Account Number: [id number] Date of Birth/Sex: [birth year] (64 yHanseno. Male) Treating RN: Baruch Gouty, RN,  BSN, [place] Primary Care Physician: Bluford Kaufmann Other Clinician: Referring Physician: Edmonia Lynch Treating Physician/Extender: Frann Rider in Treatment: 0 Edema Assessment Assessed: Shirlyn Goltz: No] [Right: No] E[Left: dema] [Right: :] Calf Left: Right: Point of Measurement: 32 cm From Medial Instep 35 cm 37 cm Ankle Left: Right: Point of Measurement: 8 cm From Medial Instep 21[name]5 cm 27 cm Vascular Assessment Pulses: Posterior Tibial Palpable: [Left:Yes] [Right:Yes] Doppler: [Left:Multiphasic] [Right:Multiphasic] Dorsalis Pedis Palpable: [Left:Yes] [Right:Yes] Doppler: [Left:Multiphasic] Extremity colors, hair growth, and conditions: Extremity Color: [Left:Normal] [Right:Mottled] Hair Growth on Extremity: [Left:Yes] [Right:Yes] Temperature of Extremity: [Left:Warm] [Right:Warm] Capillary Refill: [Left:< 3 seconds] Blood Pressure: Brachial: [Left:100] Dorsalis Pedis: 120 [Left:Dorsalis Pedis: 120] Ankle: Posterior Tibial: 115 [Left:Posterior Tibial: 122 1[name]20] [Right:1[name]22] Toe Nail Assessment Left: Right: Thick: No No Discolored: No No Deformed: No No Improper Length and Hygiene: No No Richard [name], Richard [name] ([id number]) Electronic Signature(s) Signed: 11/17/2014 3:44:26 PM By: Regan Lemming BSN, RN Entered By: Regan Lemming on 11/17/2014 11:10:44 Richard [name] ([id number]) -------------------------------------------------------------------------------- Multi Wound Chart Details Patient Name: Richard [name]. Date of Service: 11/17/2014 10:15 AM Medical Record Number: [id number] Patient Account Number: [id number] Date of Birth/Sex: [birth year] (64 yHanseno. Male) Treating RN: Baruch Gouty, RN, BSN, Velva Harman Primary Care Physician: Bluford Kaufmann Other Clinician: Referring Physician: Edmonia Lynch Treating Physician/Extender: Frann Rider in Treatment: 0 Vital Signs Height(in): 68 Pulse(bpm): 75 Weight(lbs): 165 Blood Pressure 103/66 (mmHg): Body Mass  Index(BMI): 25 Temperature(F): 98[name]3 Respiratory Rate 17 (breaths/min): Photos: [1:No Photos] [2:No Photos] [3:No Photos] Wound Location: [1:Left Lower Leg - Anterior, Proximal] [2:Lower Leg - Medial, Anterior] [3:Right Lower Leg - Lateral] Wounding Event: [1:Surgical Injury] [2:Surgical Injury] [3:Surgical Injury] Primary Etiology: [1:Pressure Ulcer] [2:Pressure Ulcer] [3:Pressure Ulcer] Comorbid History: [1:Neuropathy] [2:Neuropathy] [3:Neuropathy] Date Acquired: [1:10/24/2014] [2:10/24/2014] [3:10/24/2014] Weeks of Treatment: [1:0] [2:0] [3:0] Wound Status: [1:Open] [2:Open] [3:Open] Measurements L x W x D 3[name]2x1[name]5x0[name]2 [2:2[name]7x2[name]9x0[name]2] [3:3[name]3x0[name]7x0[name]2] (cm) Area (cm) : [1:3[name]77] [2:6[name]15] [3:1[name]814] Volume (cm) : [1:0[name]754] [2:1[name]23] [3:0[name]363] % Reduction in Area: [1:0[name]00%] [2:0[name]00%] [3:0[name]00%] % Reduction in Volume: 0[name]00% [2:0[name]00%] [3:0[name]00%] Classification: [1:Category/Stage II] [2:Category/Stage II] [3:Category/Stage II] Exudate Amount: [1:Small] [2:Small] [3:Medium] Exudate Type: [1:Serosanguineous] [2:Serosanguineous] [3:Serosanguineous] Exudate Color: [1:red, brown] [2:red, brown] [3:red, brown] Wound Margin: [1:Distinct, outline attached] [2:Distinct, outline attached] [3:Distinct, outline attached] Granulation Amount: [1:Large (67-100%)] [2:Medium (34-66%)] [3:None Present (0%)] Granulation Quality: [1:Pink, Pale] [  2:Red, Pink] [3:N/[name]] Necrotic Amount: [1:None Present (0%)] [2:Small (1-33%)] [3:Large (67-100%)] Exposed Structures: [1:Fascia: No Fat: No Tendon: No Muscle: No Joint: No Bone: No] [2:Fascia: No Fat: No Tendon: No Muscle: No Joint: No Bone: No] [3:Fascia: No Fat: No Tendon: No Muscle: No Joint: No Bone: No] Limited to Skin Limited to Skin Limited to Skin Breakdown Breakdown Breakdown Epithelialization: None Small (1-33%) None Debridement: N/[name] Debridement ([id number] N/[name] [id number]) Time-Out Taken: N/[name] Yes N/[name] Pain Control: N/[name] Lidocaine 5% topical N/[name] ointment Tissue Debrided:  N/[name] Necrotic/Eschar, N/[name] Fibrin/Slough, Subcutaneous Level: N/[name] Skin/Subcutaneous N/[name] Tissue Debridement Area (sq N/[name] 4 N/[name] cm): Instrument: N/[name] Forceps, Scissors N/[name] Bleeding: N/[name] None N/[name] Procedural Pain: N/[name] 0 N/[name] Post Procedural Pain: N/[name] 0 N/[name] Debridement Treatment N/[name] Procedure was tolerated N/[name] Response: well Post Debridement N/[name] 2[name]7x2[name]9x0[name]2 N/[name] Measurements L x W x D (cm) Post Debridement N/[name] 1[name]23 N/[name] Volume: (cm) Periwound Skin Texture: Edema: Yes Edema: Yes Edema: Yes Excoriation: No Excoriation: No Excoriation: No Induration: No Induration: No Induration: No Callus: No Callus: No Callus: No Crepitus: No Crepitus: No Crepitus: No Fluctuance: No Fluctuance: No Fluctuance: No Friable: No Friable: No Friable: No Rash: No Rash: No Rash: No Scarring: No Scarring: No Scarring: No Periwound Skin Moist: Yes Moist: Yes Moist: Yes Moisture: Maceration: No Maceration: No Maceration: No Dry/Scaly: No Dry/Scaly: No Dry/Scaly: No Periwound Skin Color: Atrophie Blanche: No Atrophie Blanche: No Atrophie Blanche: No Cyanosis: No Cyanosis: No Cyanosis: No Ecchymosis: No Ecchymosis: No Ecchymosis: No Erythema: No Erythema: No Erythema: No Hemosiderin Staining: No Hemosiderin Staining: No Hemosiderin Staining: No Mottled: No Mottled: No Mottled: No Pallor: No Pallor: No Pallor: No Rubor: No Rubor: No Rubor: No Temperature: No Abnormality No Abnormality No Abnormality Tenderness on Yes No Yes Palpation: Wound Preparation: Richard [name], Richard AMarland Kitchen ([id number]) Ulcer Cleansing: Ulcer Cleansing: Ulcer Cleansing: Rinsed/Irrigated with Rinsed/Irrigated with Rinsed/Irrigated with Saline Saline Saline Topical Anesthetic Topical Anesthetic Topical Anesthetic Applied: Other: lidocaine Applied: Other: lidocaine Applied: Other: lidocaine 4% 4% 4% Procedures Performed: N/[name] Debridement N/[name] Wound Number: 4 [id number] N/[name] Photos: No Photos No Photos N/[name] Wound  Location: Right, Anterior Lower Leg Right Lower Leg - N/[name] Anterior, Distal Wounding Event: Surgical Injury Surgical Injury N/[name] Primary Etiology: Pressure Ulcer Pressure Ulcer N/[name] Comorbid History: N/[name] Neuropathy N/[name] Date Acquired: 10/24/2014 10/24/2014 N/[name] Weeks of Treatment: 0 0 N/[name] Wound Status: Open Open N/[name] Measurements L x W x D 1[name]8x1x0[name]2 1[name]2x0[name]7x0[name]2 N/[name] (cm) Area (cm) : 1[name]414 0[name]66 N/[name] Volume (cm) : 0[name]283 0[name]132 N/[name] % Reduction in Area: N/[name] 0[name]00% N/[name] % Reduction in Volume: N/[name] 0[name]00% N/[name] Classification: N/[name] Category/Stage II N/[name] Exudate Amount: N/[name] Medium N/[name] Exudate Type: N/[name] Serosanguineous N/[name] Exudate Color: N/[name] red, brown N/[name] Wound Margin: N/[name] Distinct, outline attached N/[name] Granulation Amount: N/[name] Large (67-100%) N/[name] Granulation Quality: N/[name] N/[name] N/[name] Necrotic Amount: N/[name] None Present (0%) N/[name] Exposed Structures: N/[name] Fascia: No N/[name] Fat: No Tendon: No Muscle: No Joint: No Bone: No Limited to Skin Breakdown Epithelialization: N/[name] N/[name] N/[name] Debridement: N/[name] N/[name] N/[name] Time-Out Taken: N/[name] N/[name] N/[name] Pain Control: N/[name] N/[name] N/[name] Tissue Debrided: N/[name] N/[name] N/[name] Level: N/[name] N/[name] N/[name] Debridement Area (sq N/[name] N/[name] N/[name] cm): Richard [name], Richard [name]. ([id number]) Instrument: N/[name] N/[name] N/[name] Bleeding: N/[name] N/[name] N/[name] Procedural Pain: N/[name] N/[name] N/[name] Post Procedural Pain: N/[name] N/[name] N/[name] Debridement Treatment N/[name] N/[name] N/[name] Response: Post Debridement N/[name] N/[name] N/[name] Measurements L x W x D (cm) Post Debridement N/[name] N/[name] N/[name] Volume: (cm) Periwound Skin Texture: No Abnormalities Noted Edema: Yes N/[name] Excoriation:  No Induration: No Callus: No Crepitus: No Fluctuance: No Friable: No Rash: No Scarring: No Periwound Skin No Abnormalities Noted Moist: Yes N/[name] Moisture: Maceration: No Dry/Scaly: No Periwound Skin Color: No Abnormalities Noted Atrophie Blanche: No N/[name] Cyanosis: No Ecchymosis: No Erythema: No Hemosiderin Staining: No Mottled: No Pallor: No Rubor: No Temperature: N/[name] No Abnormality  N/[name] Tenderness on No Yes N/[name] Palpation: Wound Preparation: N/[name] Ulcer Cleansing: N/[name] Rinsed/Irrigated with Saline Topical Anesthetic Applied: Other: lidocaine 4% Procedures Performed: N/[name] N/[name] N/[name] Treatment Notes Electronic Signature(s) Signed: 11/17/2014 3:44:26 PM By: Regan Lemming BSN, RN Entered By: Regan Lemming on 11/17/2014 12:08:56 Richard [name] ([id number]) Richard [name] ([id number]) -------------------------------------------------------------------------------- [place] Details Patient Name: Richard [name]. Date of Service: 11/17/2014 10:15 AM Medical Record Number: [id number] Patient Account Number: [id number] Date of Birth/Sex: 07-05-1950 (64 yHanseno. Male) Treating RN: Baruch Gouty, RN, BSN, Velva Harman Primary Care Physician: Bluford Kaufmann Other Clinician: Referring Physician: Edmonia Lynch Treating Physician/Extender: Frann Rider in Treatment: 0 Active Inactive Orientation to the Wound Care Program Nursing Diagnoses: Knowledge deficit related to the wound healing center program Goals: Patient/caregiver will verbalize understanding of the [facility] Date Initiated: 11/17/2014 Goal Status: Active Interventions: Provide education on orientation to the wound center Notes: Wound/Skin Impairment Nursing Diagnoses: Impaired tissue integrity Knowledge deficit related to ulceration/compromised skin integrity Goals: Patient/caregiver will verbalize understanding of skin care regimen Date Initiated: 11/17/2014 Goal Status: Active Ulcer/skin breakdown will have [name] volume reduction of 30% by week 4 Date Initiated: 11/17/2014 Goal Status: Active Ulcer/skin breakdown will have [name] volume reduction of 50% by week 8 Date Initiated: 11/17/2014 Goal Status: Active Ulcer/skin breakdown will have [name] volume reduction of 80% by week 12 Date Initiated: 11/17/2014 Goal Status: Active Ulcer/skin breakdown will heal within 14 weeks Date Initiated:  11/17/2014 Richard [name], Richard [name] ([id number]) Goal Status: Active Interventions: Assess patient/caregiver ability to obtain necessary supplies Assess patient/caregiver ability to perform ulcer/skin care regimen upon admission and as needed Assess ulceration(s) every visit Provide education on ulcer and skin care Treatment Activities: Skin care regimen initiated : 11/17/2014 Topical wound management initiated : 11/17/2014 Notes: Electronic Signature(s) Signed: 11/17/2014 3:44:26 PM By: Regan Lemming BSN, RN Entered By: Regan Lemming on 11/17/2014 12:08:44 Richard [name] ([id number]) -------------------------------------------------------------------------------- Pain Assessment Details Patient Name: Richard [name]. Date of Service: 11/17/2014 10:15 AM Medical Record Number: [id number] Patient Account Number: [id number] Date of Birth/Sex: Oct 29, 1950 (64 yHanseno. Male) Treating RN: Baruch Gouty, RN, BSN, Velva Harman Primary Care Physician: Bluford Kaufmann Other Clinician: Referring Physician: Edmonia Lynch Treating Physician/Extender: Frann Rider in Treatment: 0 Active Problems Location of Pain Severity and Description of Pain Patient Has Paino No Site Locations Pain Management and Medication Current Pain Management: Electronic Signature(s) Signed: 11/17/2014 10:35:13 AM By: Regan Lemming BSN, RN Entered By: Regan Lemming on 11/17/2014 10:35:13 Richard [name] ([id number]) -------------------------------------------------------------------------------- Patient/Caregiver Education Details Patient Name: Richard [name]. Date of Service: 11/17/2014 10:15 AM Medical Record Number: [id number] Patient Account Number: [id number] Date of Birth/Gender: 08/02/1950 (64 yHanseno. Male) Treating RN: Baruch Gouty, RN, BSN, Velva Harman Primary Care Physician: Bluford Kaufmann Other Clinician: Referring Physician: Edmonia Lynch Treating Physician/Extender: Frann Rider in Treatment: 0 Education Assessment Education  Provided To: Patient Education Topics Provided Welcome To [facility]: Methods: Explain/Verbal Wound/Skin Impairment: Methods: Explain/Verbal Responses: State content correctly Electronic Signature(s) Signed: 11/17/2014 3:44:26 PM By: Regan Lemming BSN, RN Entered By: Regan Lemming on 11/17/2014 12:10:59 Richard [name] ([id number]) -------------------------------------------------------------------------------- Wound Assessment Details Patient Name: Richard [name]. Date of Service:  11/17/2014 10:15 AM Medical Record Number: [id number] Patient Account Number: [id number] Date of Birth/Sex: 08/18/1950 (64 yHanseno. Male) Treating RN: Afful, RN, BSN, [place] Primary Care Physician: Bluford Kaufmann Other Clinician: Referring Physician: Edmonia Lynch Treating Physician/Extender: Frann Rider in Treatment: 0 Wound Status Wound Number: 1 Primary Etiology: Pressure Ulcer Wound Location: Left Lower Leg - Anterior, Wound Status: Open Proximal Comorbid History: Neuropathy Wounding Event: Surgical Injury Date Acquired: 10/24/2014 Weeks Of Treatment: 0 Clustered Wound: No Photos Photo Uploaded By: Regan Lemming on 11/17/2014 15:37:38 Wound Measurements Length: (cm) 3[name]2 Width: (cm) 1[name]5 Depth: (cm) 0[name]2 Area: (cm) 3[name]77 Volume: (cm) 0[name]754 % Reduction in Area: 0% % Reduction in Volume: 0% Epithelialization: None Tunneling: No Undermining: No Wound Description Classification: Category/Stage II Wound Margin: Distinct, outline attached Exudate Amount: Small Exudate Type: Serosanguineous Exudate Color: red, brown Foul Odor After Cleansing: No Wound Bed Granulation Amount: Large (67-100%) Exposed Structure Granulation Quality: Pink, Pale Fascia Exposed: No Necrotic Amount: None Present (0%) Fat Layer Exposed: No Richard [name], Richard [name]. ([id number]) Tendon Exposed: No Muscle Exposed: No Joint Exposed: No Bone Exposed: No Limited to Skin Breakdown Periwound Skin  Texture Texture Color No Abnormalities Noted: No No Abnormalities Noted: No Callus: No Atrophie Blanche: No Crepitus: No Cyanosis: No Excoriation: No Ecchymosis: No Fluctuance: No Erythema: No Friable: No Hemosiderin Staining: No Induration: No Mottled: No Localized Edema: Yes Pallor: No Rash: No Rubor: No Scarring: No Temperature / Pain Moisture Temperature: No Abnormality No Abnormalities Noted: No Tenderness on Palpation: Yes Dry / Scaly: No Maceration: No Moist: Yes Wound Preparation Ulcer Cleansing: Rinsed/Irrigated with Saline Topical Anesthetic Applied: Other: lidocaine 4%, Electronic Signature(s) Signed: 11/17/2014 11:24:15 AM By: Regan Lemming BSN, RN Entered By: Regan Lemming on 11/17/2014 11:24:14 Richard [name] ([id number]) -------------------------------------------------------------------------------- Wound Assessment Details Patient Name: Richard [name]. Date of Service: 11/17/2014 10:15 AM Medical Record Number: [id number] Patient Account Number: [id number] Date of Birth/Sex: Aug 24, 1950 (65 yHanseno. Male) Treating RN: Baruch Gouty, RN, BSN, [place] Primary Care Physician: Bluford Kaufmann Other Clinician: Referring Physician: Edmonia Lynch Treating Physician/Extender: Frann Rider in Treatment: 0 Wound Status Wound Number: 2 Primary Etiology: Pressure Ulcer Wound Location: Lower Leg - Medial, Anterior Wound Status: Open Wounding Event: Surgical Injury Comorbid History: Neuropathy Date Acquired: 10/24/2014 Weeks Of Treatment: 0 Clustered Wound: No Photos Photo Uploaded By: Regan Lemming on 11/17/2014 15:38:38 Wound Measurements Length: (cm) 2[name]7 Width: (cm) 2[name]9 Depth: (cm) 0[name]2 Area: (cm) 6[name]15 Volume: (cm) 1[name]23 % Reduction in Area: 0% % Reduction in Volume: 0% Epithelialization: Small (1-33%) Tunneling: No Undermining: No Wound Description Classification: Category/Stage II Wound Margin: Distinct, outline attached Exudate Amount:  Small Exudate Type: Serosanguineous Exudate Color: red, brown Foul Odor After Cleansing: No Wound Bed Granulation Amount: Medium (34-66%) Exposed Structure Granulation Quality: Red, Pink Fascia Exposed: No Necrotic Amount: Small (1-33%) Fat Layer Exposed: No Necrotic Quality: Adherent Slough Tendon Exposed: No Harm, Taitum [name]. ([id number]) Muscle Exposed: No Joint Exposed: No Bone Exposed: No Limited to Skin Breakdown Periwound Skin Texture Texture Color No Abnormalities Noted: No No Abnormalities Noted: No Callus: No Atrophie Blanche: No Crepitus: No Cyanosis: No Excoriation: No Ecchymosis: No Fluctuance: No Erythema: No Friable: No Hemosiderin Staining: No Induration: No Mottled: No Localized Edema: Yes Pallor: No Rash: No Rubor: No Scarring: No Temperature / Pain Moisture Temperature: No Abnormality No Abnormalities Noted: No Dry / Scaly: No Maceration: No Moist: Yes Wound Preparation Ulcer Cleansing: Rinsed/Irrigated with Saline Topical Anesthetic Applied: Other: lidocaine 4%, Electronic Signature(s) Signed: 11/17/2014 11:25:30 AM By: Regan Lemming BSN, RN  Entered By: Regan Lemming on 11/17/2014 11:25:30 Richard [name] ([id number]) -------------------------------------------------------------------------------- Wound Assessment Details Patient Name: Richard [name], Richard [name]. Date of Service: 11/17/2014 10:15 AM Medical Record Number: [id number] Patient Account Number: [id number] Date of Birth/Sex: [birth year] (64 yHanseno. Male) Treating RN: Baruch Gouty, RN, BSN, [place] Primary Care Physician: Bluford Kaufmann Other Clinician: Referring Physician: Edmonia Lynch Treating Physician/Extender: Frann Rider in Treatment: 0 Wound Status Wound Number: 3 Primary Etiology: Pressure Ulcer Wound Location: Right Lower Leg - Lateral Wound Status: Open Wounding Event: Surgical Injury Comorbid History: Neuropathy Date Acquired: 10/24/2014 Weeks Of Treatment: 0 Clustered  Wound: No Photos Photo Uploaded By: Regan Lemming on 11/17/2014 15:38:39 Wound Measurements Length: (cm) 3[name]3 Width: (cm) 0[name]7 Depth: (cm) 0[name]2 Area: (cm) 1[name]814 Volume: (cm) 0[name]363 % Reduction in Area: 0% % Reduction in Volume: 0% Epithelialization: None Tunneling: No Undermining: No Wound Description Classification: Category/Stage II Wound Margin: Distinct, outline attached Exudate Amount: Medium Exudate Type: Serosanguineous Exudate Color: red, brown Foul Odor After Cleansing: No Wound Bed Granulation Amount: None Present (0%) Exposed Structure Necrotic Amount: Large (67-100%) Fascia Exposed: No Necrotic Quality: Adherent Slough Fat Layer Exposed: No Tendon Exposed: No Richard [name], Richard [name]. ([id number]) Muscle Exposed: No Joint Exposed: No Bone Exposed: No Limited to Skin Breakdown Periwound Skin Texture Texture Color No Abnormalities Noted: No No Abnormalities Noted: No Callus: No Atrophie Blanche: No Crepitus: No Cyanosis: No Excoriation: No Ecchymosis: No Fluctuance: No Erythema: No Friable: No Hemosiderin Staining: No Induration: No Mottled: No Localized Edema: Yes Pallor: No Rash: No Rubor: No Scarring: No Temperature / Pain Moisture Temperature: No Abnormality No Abnormalities Noted: No Tenderness on Palpation: Yes Dry / Scaly: No Maceration: No Moist: Yes Wound Preparation Ulcer Cleansing: Rinsed/Irrigated with Saline Topical Anesthetic Applied: Other: lidocaine 4%, Electronic Signature(s) Signed: 11/17/2014 11:26:28 AM By: Regan Lemming BSN, RN Entered By: Regan Lemming on 11/17/2014 11:26:28 Richard [name] ([id number]) -------------------------------------------------------------------------------- Wound Assessment Details Patient Name: Richard [name]. Date of Service: 11/17/2014 10:15 AM Medical Record Number: [id number] Patient Account Number: [id number] Date of Birth/Sex: 1951-02-22 (64 yHanseno. Male) Treating RN: Baruch Gouty, RN, BSN, Velva Harman Primary  Care Physician: Bluford Kaufmann Other Clinician: Referring Physician: Edmonia Lynch Treating Physician/Extender: Frann Rider in Treatment: 0 Wound Status Wound Number: 4 Primary Etiology: Pressure Ulcer Wound Location: Right, Anterior Lower Leg Wound Status: Open Wounding Event: Surgical Injury Date Acquired: 10/24/2014 Weeks Of Treatment: 0 Clustered Wound: No Photos Photo Uploaded By: Regan Lemming on 11/17/2014 15:42:16 Wound Measurements Length: (cm) 1[name]8 % Reduction Width: (cm) 1 % Reduction Depth: (cm) 0[name]2 Area: (cm) 1[name]414 Volume: (cm) 0[name]283 in Area: in Volume: Periwound Skin Texture Texture Color No Abnormalities Noted: No No Abnormalities Noted: No Moisture No Abnormalities Noted: No Electronic Signature(s) Signed: 11/17/2014 3:44:26 PM By: Regan Lemming BSN, RN Entered By: Regan Lemming on 11/17/2014 11:15:50 Richard [name] ([id number]) -------------------------------------------------------------------------------- Wound Assessment Details Patient Name: Richard [name]. Date of Service: 11/17/2014 10:15 AM Medical Record Number: [id number] Patient Account Number: [id number] Date of Birth/Sex: 1950/08/11 (64 yHanseno. Male) Treating RN: Baruch Gouty, RN, BSN, Velva Harman Primary Care Physician: Bluford Kaufmann Other Clinician: Referring Physician: Edmonia Lynch Treating Physician/Extender: Frann Rider in Treatment: 0 Wound Status Wound Number: 5 Primary Etiology: Pressure Ulcer Wound Location: Right Lower Leg - Anterior, Wound Status: Open Distal Comorbid History: Neuropathy Wounding Event: Surgical Injury Date Acquired: 10/24/2014 Weeks Of Treatment: 0 Clustered Wound: No Photos Photo Uploaded By: Regan Lemming on 11/17/2014 15:42:16 Wound Measurements Length: (cm) 1[name]2 Width: (cm) 0[name]7 Depth: (cm) 0[name]2 Area: (cm) 0[name]66 Volume: (  cm) 0[name]132 % Reduction in Area: 0% % Reduction in Volume: 0% Tunneling: No Undermining: No Wound  Description Classification: Category/Stage II Wound Margin: Distinct, outline attached Exudate Amount: Medium Exudate Type: Serosanguineous Exudate Color: red, brown Foul Odor After Cleansing: No Wound Bed Granulation Amount: Large (67-100%) Exposed Structure Necrotic Amount: None Present (0%) Fascia Exposed: No Fat Layer Exposed: No Richard [name], Richard [name]. ([id number]) Tendon Exposed: No Muscle Exposed: No Joint Exposed: No Bone Exposed: No Limited to Skin Breakdown Periwound Skin Texture Texture Color No Abnormalities Noted: No No Abnormalities Noted: No Callus: No Atrophie Blanche: No Crepitus: No Cyanosis: No Excoriation: No Ecchymosis: No Fluctuance: No Erythema: No Friable: No Hemosiderin Staining: No Induration: No Mottled: No Localized Edema: Yes Pallor: No Rash: No Rubor: No Scarring: No Temperature / Pain Moisture Temperature: No Abnormality No Abnormalities Noted: No Tenderness on Palpation: Yes Dry / Scaly: No Maceration: No Moist: Yes Wound Preparation Ulcer Cleansing: Rinsed/Irrigated with Saline Topical Anesthetic Applied: Other: lidocaine 4%, Electronic Signature(s) Signed: 11/17/2014 11:27:22 AM By: Regan Lemming BSN, RN Entered By: Regan Lemming on 11/17/2014 11:27:22 Richard [name] ([id number]) -------------------------------------------------------------------------------- Vitals Details Patient Name: Richard [name]. Date of Service: 11/17/2014 10:15 AM Medical Record Number: [id number] Patient Account Number: [id number] Date of Birth/Sex: [birth year] (64 yHanseno. Male) Treating RN: Afful, RN, BSN, [place] Primary Care Physician: Bluford Kaufmann Other Clinician: Referring Physician: Edmonia Lynch Treating Physician/Extender: Frann Rider in Treatment: 0 Vital Signs Time Taken: 10:35 Temperature (F): 98[name]3 Height (in): 68 Pulse (bpm): 75 Source: Stated Respiratory Rate (breaths/min): 17 Weight (lbs): 165 Blood Pressure (mmHg):  103/66 Source: Stated Reference Range: 80 - 120 mg / dl Body Mass Index (BMI): 25[name]1 Electronic Signature(s) Signed: 11/17/2014 10:36:58 AM By: Regan Lemming BSN, RN Entered By: Regan Lemming on 11/17/2014 10:36:58

## 2014-11-17 NOTE — Progress Notes (Signed)
NEIZAN, DEBRUHL ([id number]) Visit Report for 11/17/2014 Abuse/Suicide Risk Screen Details Patient Name: Richard [name], Richard [name]. Date of Service: 11/17/2014 10:15 AM Medical Record Number: [id number] Patient Account Number: [id number] Date of Birth/Sex: [birth year] (64 y.o. Male) Treating RN: Baruch Gouty, RN, BSN, Velva Harman Primary Care Physician: Bluford Kaufmann Other Clinician: Referring Physician: Edmonia Lynch Treating Physician/Extender: Frann Rider in Treatment: 0 Abuse/Suicide Risk Screen Items Answer ABUSE/SUICIDE RISK SCREEN: Has anyone close to you tried to hurt or harm you recentlyo No Do you feel uncomfortable with anyone in your familyo No Has anyone forced you do things that you didnot want to doo No Do you have any thoughts of harming yourselfo No Patient displays signs or symptoms of abuse and/or neglect. No Electronic Signature(s) Signed: 11/17/2014 10:37:07 AM By: Regan Lemming BSN, RN Entered By: Regan Lemming on 11/17/2014 10:37:07 [name] ([id number]) -------------------------------------------------------------------------------- Activities of Daily Living Details Patient Name: Richard Hidden A. Date of Service: 11/17/2014 10:15 AM Medical Record Number: [id number] Patient Account Number: [id number] Date of Birth/Sex: 1950-05-12 (64 y.o. Male) Treating RN: Baruch Gouty, RN, BSN, Velva Harman Primary Care Physician: Bluford Kaufmann Other Clinician: Referring Physician: Edmonia Lynch Treating Physician/Extender: Frann Rider in Treatment: 0 Activities of Daily Living Items Answer Activities of Daily Living (Please select one for each item) Drive Automobile Not Able Take Medications Completely Able Use Telephone Completely Able Care for Appearance Completely Able Use Toilet Completely Able Bath / Shower Completely Able Dress Self Completely Able Feed Self Completely Able Walk Need Assistance Get In / Out Bed Completely Able Housework Completely [place] for Self Completely Able Electronic Signature(s) Signed: 11/17/2014 10:39:17 AM By: Regan Lemming BSN, RN Entered By: Regan Lemming on 11/17/2014 10:39:17 [name] ([id number]) -------------------------------------------------------------------------------- Education Assessment Details Patient Name: Richard Hidden A. Date of Service: 11/17/2014 10:15 AM Medical Record Number: [id number] Patient Account Number: [id number] Date of Birth/Sex: January 06, 1951 (64 y.o. Male) Treating RN: Baruch Gouty, RN, BSN, Velva Harman Primary Care Physician: Bluford Kaufmann Other Clinician: Referring Physician: Edmonia Lynch Treating Physician/Extender: Frann Rider in Treatment: 0 Primary Learner Assessed: Patient Learning Preferences/Education Level/Primary Language Learning Preference: Explanation Highest Education Level: College or Above Preferred Language: English Cognitive Barrier Assessment/Beliefs Language Barrier: No Physical Barrier Assessment Impaired Vision: No Impaired Hearing: No Decreased Hand dexterity: No Knowledge/Comprehension Assessment Knowledge Level: High Comprehension Level: High Ability to understand written High instructions: Motivation Assessment Anxiety Level: Calm Cooperation: Cooperative Education Importance: Acknowledges Need Interest in Health Problems: Asks Questions Perception: Coherent Willingness to Engage in Self- High Management Activities: Readiness to Engage in Self- High Management Activities: Electronic Signature(s) Signed: 11/17/2014 10:38:42 AM By: Regan Lemming BSN, RN Entered By: Regan Lemming on 11/17/2014 10:38:42 [name] ([id number]) -------------------------------------------------------------------------------- Fall Risk Assessment Details Patient Name: Richard Hidden A. Date of Service: 11/17/2014 10:15 AM Medical Record Number: [id number] Patient Account Number: [id number] Date  of Birth/Sex: 07-12-1950 (64 y.o. Male) Treating RN: Baruch Gouty, RN, BSN, Velva Harman Primary Care Physician: Bluford Kaufmann Other Clinician: Referring Physician: Edmonia Lynch Treating Physician/Extender: Frann Rider in Treatment: 0 Fall Risk Assessment Items FALL RISK ASSESSMENT: History of falling - immediate or within 3 months 0 No Secondary diagnosis 0 No Ambulatory aid None/bed rest/wheelchair/nurse 0 Yes Crutches/cane/walker 0 No Furniture 0 No IV Access/Saline Lock 0 No Gait/Training Normal/bed rest/immobile 0 No Weak 10 Yes Impaired 20 Yes Mental Status Oriented to own ability 0 Yes Electronic Signature(s) Signed: 11/17/2014 10:38:14 AM By: Regan Lemming BSN, RN Entered By: Regan Lemming on  11/17/2014 10:38:14 [name] ([id number]) -------------------------------------------------------------------------------- Foot Assessment Details Patient Name: Richard [name], [name] Date of Service: 11/17/2014 10:15 AM Medical Record Number: [id number] Patient Account Number: [id number] Date of Birth/Sex: 12/28/1950 (64 y.o. Male) Treating RN: Baruch Gouty, RN, BSN, Velva Harman Primary Care Physician: Bluford Kaufmann Other Clinician: Referring Physician: Edmonia Lynch Treating Physician/Extender: Frann Rider in Treatment: 0 Foot Assessment Items Site Locations + = Sensation present, - = Sensation absent, C = Callus, U = Ulcer R = Redness, W = Warmth, M = Maceration, PU = Pre-ulcerative lesion F = Fissure, S = Swelling, D = Dryness Assessment Right: Left: Other Deformity: No No Prior Foot Ulcer: No No Prior Amputation: No No Charcot Joint: No No Ambulatory Status: Non-ambulatory Assistance Device: Wheelchair Gait: Administrator, arts) Signed: 11/17/2014 10:37:24 AM By: Regan Lemming BSN, RN Entered By: Regan Lemming on 11/17/2014 10:37:24 [name] ([id number]) -------------------------------------------------------------------------------- Nutrition Risk  Assessment Details Patient Name: Richard Hidden A. Date of Service: 11/17/2014 10:15 AM Medical Record Number: [id number] Patient Account Number: [id number] Date of Birth/Sex: 07-10-1950 (64 y.o. Male) Treating RN: Baruch Gouty, RN, BSN, Velva Harman Primary Care Physician: Bluford Kaufmann Other Clinician: Referring Physician: Edmonia Lynch Treating Physician/Extender: Frann Rider in Treatment: 0 Height (in): 68 Weight (lbs): 165 Body Mass Index (BMI): 25.1 Nutrition Risk Assessment Items NUTRITION RISK SCREEN: I have an illness or condition that made me change the kind and/or 0 No amount of food I eat I eat fewer than two meals per day 0 No I eat few fruits and vegetables, or milk products 0 No I have three or more drinks of beer, liquor or wine almost every day 0 No I have tooth or mouth problems that make it hard for me to eat 0 No I don't always have enough money to buy the food I need 0 No I eat alone most of the time 0 No I take three or more different prescribed or over-the-counter drugs a 0 No day Without wanting to, I have lost or gained 10 pounds in the last six 0 No months I am not always physically able to shop, cook and/or feed myself 0 No Nutrition Protocols Good Risk Protocol 0 No interventions needed Moderate Risk Protocol Electronic Signature(s) Signed: 11/17/2014 10:37:34 AM By: Regan Lemming BSN, RN Entered By: Regan Lemming on 11/17/2014 10:37:33

## 2014-11-24 ENCOUNTER — Encounter: Payer: BLUE CROSS/BLUE SHIELD | Admitting: Surgery

## 2014-11-24 DIAGNOSIS — S81811A Laceration without foreign body, right lower leg, initial encounter: Secondary | ICD-10-CM | POA: Diagnosis not present

## 2014-11-25 NOTE — Progress Notes (Signed)
Richard [name] ([id number]) Visit Report for 11/24/2014 Arrival Information Details Patient Name: Richard [name], Richard [name]. Date of Service: 11/24/2014 8:15 AM Medical Record Number: [id number] Patient Account Number: [id number] Date of Birth/Sex: [birth year] (64 y.o. Male) Treating RN: Montey Hora Primary Care Physician: Bluford Kaufmann Other Clinician: Referring Physician: Bluford Kaufmann Treating Physician/Extender: Frann Rider in Treatment: 1 Visit Information History Since Last Visit Added or deleted any medications: No Patient Arrived: Wheel Chair Any new allergies or adverse reactions: No Arrival Time: 08:23 Had a fall or experienced change in No Accompanied By: self activities of daily living that may affect Transfer Assistance: None risk of falls: Patient Identification Verified: Yes Signs or symptoms of abuse/neglect since last No Secondary Verification Process Yes visito Completed: Hospitalized since last visit: No Patient Requires Transmission- No Pain Present Now: No Based Precautions: Patient Has Alerts: Yes Patient Alerts: Patient on Blood Thinner Xarelto Electronic Signature(s) Signed: 11/24/2014 4:45:23 PM By: Montey Hora Entered By: Montey Hora on 11/24/2014 08:23:32 [name] ([id number]) -------------------------------------------------------------------------------- Encounter Discharge Information Details Patient Name: Richard Hidden A. Date of Service: 11/24/2014 8:15 AM Medical Record Number: [id number] Patient Account Number: [id number] Date of Birth/Sex: 12-Jan-1951 (64 y.o. Male) Treating RN: Primary Care Physician: Bluford Kaufmann Other Clinician: Referring Physician: Bluford Kaufmann Treating Physician/Extender: Frann Rider in Treatment: 1 Encounter Discharge Information Items Discharge Pain Level: 0 Discharge Condition: Stable Ambulatory Status: Wheelchair Discharge Destination: Nursing Home Transportation:  Private Auto Accompanied By: self Schedule Follow-up Appointment: Yes Medication Reconciliation completed No and provided to Patient/Care [name]: Provided on Clinical Summary of Care: 11/24/2014 Form Type Recipient Paper Patient [facility] Electronic Signature(s) Signed: 11/24/2014 12:39:43 PM By: Montey Hora Previous Signature: 11/24/2014 9:32:04 AM Version By: Ruthine Dose Entered By: Montey Hora on 11/24/2014 12:39:43 [name] ([id number]) -------------------------------------------------------------------------------- Lower Extremity Assessment Details Patient Name: Richard Hidden A. Date of Service: 11/24/2014 8:15 AM Medical Record Number: [id number] Patient Account Number: [id number] Date of Birth/Sex: [birth year] (64 y.o. Male) Treating RN: Montey Hora Primary Care Physician: Bluford Kaufmann Other Clinician: Referring Physician: Bluford Kaufmann Treating Physician/Extender: Frann Rider in Treatment: 1 Edema Assessment Assessed: Shirlyn Goltz: No] Patrice Paradise: No] Edema: [Left: Ye] [Right: s] Calf Left: Right: Point of Measurement: 32 cm From Medial Instep cm 38 cm Ankle Left: Right: Point of Measurement: 8 cm From Medial Instep cm 28.8 cm Vascular Assessment Pulses: Posterior Tibial Dorsalis Pedis Palpable: [Right:Yes] Extremity colors, hair growth, and conditions: Extremity Color: [Right:Mottled] Hair Growth on Extremity: [Right:Yes] Temperature of Extremity: [Right:Warm] Capillary Refill: [Right:< 3 seconds] Toe Nail Assessment Left: Right: Thick: No Discolored: No Deformed: No Improper Length and Hygiene: No Electronic Signature(s) Signed: 11/24/2014 4:45:23 PM By: Montey Hora Entered By: Montey Hora on 11/24/2014 08:41:04 [name] ([id number]) -------------------------------------------------------------------------------- Multi Wound Chart Details Patient Name: Richard Hidden A. Date of Service: 11/24/2014 8:15 AM Medical Record  Number: [id number] Patient Account Number: [id number] Date of Birth/Sex: 1951/02/21 (64 y.o. Male) Treating RN: Montey Hora Primary Care Physician: Bluford Kaufmann Other Clinician: Referring Physician: Bluford Kaufmann Treating Physician/Extender: Frann Rider in Treatment: 1 Vital Signs Height(in): 68 Pulse(bpm): 66 Weight(lbs): 165 Blood Pressure 118/69 (mmHg): Body Mass Index(BMI): 25 Temperature(F): 98.4 Respiratory Rate 18 (breaths/min): Photos: [1:No Photos] [2:No Photos] [3:No Photos] Wound Location: [1:Left, Proximal, Anterior Lower Leg] [2:Right, Medial, Anterior Lower Leg] [3:Right, Lateral Lower Leg] Wounding Event: [1:Surgical Injury] [2:Surgical Injury] [3:Surgical Injury] Primary Etiology: [1:Trauma, Other] [2:Trauma, Other] [3:Trauma, Other] Date Acquired: [1:10/24/2014] [2:10/24/2014] [3:10/24/2014] Weeks of Treatment: [1:1] [2:1] [3:1] Wound Status: [1:Open] [2:Open] [3:Open]  Measurements L x W x D 2.9x1.2x0.1 [2:2.4x2.2x0.2] [3:1.8x0.7x0.4] (cm) Area (cm) : [1:2.733] [2:4.147] [3:0.99] Volume (cm) : [1:0.273] [2:0.829] [3:0.396] % Reduction in Area: [1:27.50%] [2:32.60%] [3:45.40%] % Reduction in Volume: 63.80% [2:32.60%] [3:-9.10%] Classification: [1:Category/Stage II] [2:Category/Stage II] [3:Category/Stage II] Periwound Skin Texture: No Abnormalities Noted [2:No Abnormalities Noted] [3:No Abnormalities Noted] Periwound Skin [1:No Abnormalities Noted] [2:No Abnormalities Noted] [3:No Abnormalities Noted] Moisture: Periwound Skin Color: No Abnormalities Noted [2:No Abnormalities Noted] [3:No Abnormalities Noted] Tenderness on [1:No] [2:No] [3:No] Wound Number: 4 5 N/A Photos: No Photos No Photos N/A Wound Location: Right, Anterior Lower Leg Right, Distal, Anterior N/A Lower Leg Wounding Event: Surgical Injury Surgical Injury N/A Primary Etiology: Trauma, Other Trauma, Other N/A Date Acquired: 10/24/2014 10/24/2014 N/A Weeks of Treatment: 1 1  N/A Gritz, Jalyn A. ([id number]) Wound Status: Open Open N/A Measurements L x W x D 1.6x0.7x0.2 1.2x0.9x0.2 N/A (cm) Area (cm) : 0.88 0.848 N/A Volume (cm) : 0.176 0.17 N/A % Reduction in Area: 37.80% -28.50% N/A % Reduction in Volume: 37.80% -28.80% N/A Classification: N/A Category/Stage II N/A Periwound Skin Texture: No Abnormalities Noted No Abnormalities Noted N/A Periwound Skin No Abnormalities Noted No Abnormalities Noted N/A Moisture: Periwound Skin Color: No Abnormalities Noted No Abnormalities Noted N/A Tenderness on No No N/A Palpation: Treatment Notes Electronic Signature(s) Signed: 11/24/2014 4:45:23 PM By: Montey Hora Entered By: Montey Hora on 11/24/2014 08:38:34 [name] ([id number]) -------------------------------------------------------------------------------- [place] Details Patient Name: Richard Hidden A. Date of Service: 11/24/2014 8:15 AM Medical Record Number: [id number] Patient Account Number: [id number] Date of Birth/Sex: 02-25-1951 (64 y.o. Male) Treating RN: Montey Hora Primary Care Physician: Bluford Kaufmann Other Clinician: Referring Physician: Bluford Kaufmann Treating Physician/Extender: Frann Rider in Treatment: 1 Active Inactive Orientation to the Wound Care Program Nursing Diagnoses: Knowledge deficit related to the wound healing center program Goals: Patient/caregiver will verbalize understanding of the [place] Program Date Initiated: 11/17/2014 Goal Status: Active Interventions: Provide education on orientation to the wound center Notes: Wound/Skin Impairment Nursing Diagnoses: Impaired tissue integrity Knowledge deficit related to ulceration/compromised skin integrity Goals: Patient/caregiver will verbalize understanding of skin care regimen Date Initiated: 11/17/2014 Goal Status: Active Ulcer/skin breakdown will have a volume reduction of 30% by week 4 Date Initiated:  11/17/2014 Goal Status: Active Ulcer/skin breakdown will have a volume reduction of 50% by week 8 Date Initiated: 11/17/2014 Goal Status: Active Ulcer/skin breakdown will have a volume reduction of 80% by week 12 Date Initiated: 11/17/2014 Goal Status: Active Ulcer/skin breakdown will heal within 14 weeks Date Initiated: 11/17/2014 HILL, MACKIE ([id number]) Goal Status: Active Interventions: Assess patient/caregiver ability to obtain necessary supplies Assess patient/caregiver ability to perform ulcer/skin care regimen upon admission and as needed Assess ulceration(s) every visit Provide education on ulcer and skin care Treatment Activities: Skin care regimen initiated : 11/24/2014 Topical wound management initiated : 11/24/2014 Notes: Electronic Signature(s) Signed: 11/24/2014 4:45:23 PM By: Montey Hora Entered By: Montey Hora on 11/24/2014 08:38:28 [name] ([id number]) -------------------------------------------------------------------------------- Patient/Caregiver Education Details Patient Name: Richard Hidden A. Date of Service: 11/24/2014 8:15 AM Medical Record Number: [id number] Patient Account Number: [id number] Date of Birth/Gender: 1951-04-14 (63 y.o. Male) Treating RN: Montey Hora Primary Care Physician: Bluford Kaufmann Other Clinician: Referring Physician: Bluford Kaufmann Treating Physician/Extender: Frann Rider in Treatment: 1 Education Assessment Education Provided To: Patient Education Topics Provided Wound/Skin Impairment: Handouts: Other: wound care as ordered and wound healing Methods: Demonstration, Explain/Verbal Responses: State content correctly Electronic Signature(s) Signed: 11/24/2014 8:47:29 AM By: Montey Hora Entered By:  Montey Hora on 11/24/2014 08:47:28 JOHNSTON, MADDOCKS ([id number]) -------------------------------------------------------------------------------- Wound Assessment Details Patient Name: KEILON, RESSEL A. Date of Service: 11/24/2014 8:15 AM Medical Record Number: [id number] Patient Account Number: [id number] Date of Birth/Sex: 29-Jul-1950 (64 y.o. Male) Treating RN: Montey Hora Primary Care Physician: Bluford Kaufmann Other Clinician: Referring Physician: Bluford Kaufmann Treating Physician/Extender: Frann Rider in Treatment: 1 Wound Status Wound Number: 1 Primary Etiology: Trauma, Other Wound Location: Left Lower Leg - Anterior, Wound Status: Open Proximal Comorbid History: Neuropathy Wounding Event: Surgical Injury Date Acquired: 10/24/2014 Weeks Of Treatment: 1 Clustered Wound: No Photos Photo Uploaded By: Montey Hora on 11/24/2014 11:38:29 Wound Measurements Length: (cm) 2.9 Width: (cm) 1.2 Depth: (cm) 0.1 Area: (cm) 2.733 Volume: (cm) 0.273 % Reduction in Area: 27.5% % Reduction in Volume: 63.8% Epithelialization: None Tunneling: No Undermining: No Wound Description Full Thickness Without Exposed Classification: Support Structures Wound Margin: Distinct, outline attached Exudate Small Amount: Exudate Type: Serosanguineous Exudate Color: red, brown Foul Odor After Cleansing: No Wound Bed Granulation Amount: Large (67-100%) Exposed Structure Granulation Quality: Pink, Pale Fascia Exposed: No Kirschenmann, Khaliel A. ([id number]) Necrotic Amount: None Present (0%) Fat Layer Exposed: No Tendon Exposed: No Muscle Exposed: No Joint Exposed: No Bone Exposed: No Limited to Skin Breakdown Periwound Skin Texture Texture Color No Abnormalities Noted: No No Abnormalities Noted: No Callus: No Atrophie Blanche: No Crepitus: No Cyanosis: No Excoriation: No Ecchymosis: No Fluctuance: No Erythema: No Friable: No Hemosiderin Staining: No Induration: No Mottled: No Localized Edema: Yes Pallor: No Rash: No Rubor: No Scarring: No Temperature / Pain Moisture Temperature: No Abnormality No Abnormalities Noted: No Tenderness on Palpation:  Yes Dry / Scaly: No Maceration: No Moist: Yes Wound Preparation Ulcer Cleansing: Rinsed/Irrigated with Saline Topical Anesthetic Applied: Other: lidocaine 4%, Electronic Signature(s) Signed: 11/24/2014 8:44:13 AM By: Montey Hora Entered By: Montey Hora on 11/24/2014 08:44:13 [name] ([id number]) -------------------------------------------------------------------------------- Wound Assessment Details Patient Name: Richard Hidden A. Date of Service: 11/24/2014 8:15 AM Medical Record Number: [id number] Patient Account Number: [id number] Date of Birth/Sex: 01/30/1951 (64 y.o. Male) Treating RN: Montey Hora Primary Care Physician: Bluford Kaufmann Other Clinician: Referring Physician: Bluford Kaufmann Treating Physician/Extender: Frann Rider in Treatment: 1 Wound Status Wound Number: 2 Primary Etiology: Trauma, Other Wound Location: Right Lower Leg - Medial, Wound Status: Open Anterior Comorbid History: Neuropathy Wounding Event: Surgical Injury Date Acquired: 10/24/2014 Weeks Of Treatment: 1 Clustered Wound: No Photos Photo Uploaded By: Montey Hora on 11/24/2014 11:38:30 Wound Measurements Length: (cm) 2.4 Width: (cm) 2.2 Depth: (cm) 0.2 Area: (cm) 4.147 Volume: (cm) 0.829 % Reduction in Area: 32.6% % Reduction in Volume: 32.6% Epithelialization: Small (1-33%) Tunneling: No Undermining: No Wound Description Full Thickness Without Exposed Classification: Support Structures Wound Margin: Distinct, outline attached Exudate Small Amount: Exudate Type: Serosanguineous Exudate Color: red, brown Foul Odor After Cleansing: No Wound Bed Granulation Amount: Medium (34-66%) Exposed Structure Granulation Quality: Red, Pink Fascia Exposed: No Gienger, Avant A. ([id number]) Necrotic Amount: Medium (34-66%) Fat Layer Exposed: No Necrotic Quality: Adherent Slough Tendon Exposed: No Muscle Exposed: No Joint Exposed: No Bone Exposed:  No Limited to Skin Breakdown Periwound Skin Texture Texture Color No Abnormalities Noted: No No Abnormalities Noted: No Callus: No Atrophie Blanche: No Crepitus: No Cyanosis: No Excoriation: No Ecchymosis: No Fluctuance: No Erythema: No Friable: No Hemosiderin Staining: No Induration: No Mottled: No Localized Edema: Yes Pallor: No Rash: No Rubor: No Scarring: No Temperature / Pain Moisture Temperature: No Abnormality No Abnormalities Noted: No Dry / Scaly: No Maceration: No Moist:  Yes Wound Preparation Ulcer Cleansing: Rinsed/Irrigated with Saline Topical Anesthetic Applied: Other: lidocaine 4%, Treatment Notes Wound #2 (Right, Medial, Anterior Lower Leg) 1. Cleansed with: Clean wound with Normal Saline 2. Anesthetic Topical Lidocaine 4% cream to wound bed prior to debridement 4. Dressing Applied: Prisma Ag 5. Secondary Dressing Applied Bordered Foam Dressing Electronic Signature(s) Signed: 11/24/2014 8:44:32 AM By: Montey Hora Entered By: Montey Hora on 11/24/2014 08:44:32 [name] ([id number]) -------------------------------------------------------------------------------- Wound Assessment Details Patient Name: Richard Hidden A. Date of Service: 11/24/2014 8:15 AM Medical Record Number: [id number] Patient Account Number: [id number] Date of Birth/Sex: 21-Feb-1951 (64 y.o. Male) Treating RN: Montey Hora Primary Care Physician: Bluford Kaufmann Other Clinician: Referring Physician: Bluford Kaufmann Treating Physician/Extender: Frann Rider in Treatment: 1 Wound Status Wound Number: 3 Primary Etiology: Trauma, Other Wound Location: Right Lower Leg - Lateral Wound Status: Open Wounding Event: Surgical Injury Comorbid History: Neuropathy Date Acquired: 10/24/2014 Weeks Of Treatment: 1 Clustered Wound: No Photos Photo Uploaded By: Montey Hora on 11/24/2014 11:38:01 Wound Measurements Length: (cm) 1.8 Width: (cm) 0.7 Depth:  (cm) 0.4 Area: (cm) 0.99 Volume: (cm) 0.396 % Reduction in Area: 45.4% % Reduction in Volume: -9.1% Epithelialization: None Tunneling: No Undermining: No Wound Description Full Thickness Without Exposed Classification: Support Structures Wound Margin: Distinct, outline attached Exudate Medium Amount: Exudate Type: Serosanguineous Exudate Color: red, brown Foul Odor After Cleansing: No Wound Bed Granulation Amount: None Present (0%) Exposed Structure Necrotic Amount: Large (67-100%) Fascia Exposed: No Necrotic Quality: Adherent Slough Fat Layer Exposed: No Haglund, Terin A. ([id number]) Tendon Exposed: No Muscle Exposed: No Joint Exposed: No Bone Exposed: No Limited to Skin Breakdown Periwound Skin Texture Texture Color No Abnormalities Noted: No No Abnormalities Noted: No Callus: No Atrophie Blanche: No Crepitus: No Cyanosis: No Excoriation: No Ecchymosis: No Fluctuance: No Erythema: No Friable: No Hemosiderin Staining: No Induration: No Mottled: No Localized Edema: Yes Pallor: No Rash: No Rubor: No Scarring: No Temperature / Pain Moisture Temperature: No Abnormality No Abnormalities Noted: No Tenderness on Palpation: Yes Dry / Scaly: No Maceration: No Moist: Yes Wound Preparation Ulcer Cleansing: Rinsed/Irrigated with Saline Topical Anesthetic Applied: Other: lidocaine 4%, Treatment Notes Wound #3 (Right, Lateral Lower Leg) 1. Cleansed with: Clean wound with Normal Saline 2. Anesthetic Topical Lidocaine 4% cream to wound bed prior to debridement 4. Dressing Applied: Aquacel Ag 5. Secondary Dressing Applied Bordered Foam Dressing Electronic Signature(s) Signed: 11/24/2014 8:44:58 AM By: Montey Hora Entered By: Montey Hora on 11/24/2014 08:44:58 [name] ([id number]) -------------------------------------------------------------------------------- Wound Assessment Details Patient Name: Richard Hidden A. Date of Service:  11/24/2014 8:15 AM Medical Record Number: [id number] Patient Account Number: [id number] Date of Birth/Sex: [birth year] (64 y.o. Male) Treating RN: Montey Hora Primary Care Physician: Bluford Kaufmann Other Clinician: Referring Physician: Bluford Kaufmann Treating Physician/Extender: Frann Rider in Treatment: 1 Wound Status Wound Number: 4 Primary Etiology: Trauma, Other Wound Location: Right Lower Leg - Anterior Wound Status: Open Wounding Event: Surgical Injury Comorbid History: Neuropathy Date Acquired: 10/24/2014 Weeks Of Treatment: 1 Clustered Wound: No Photos Photo Uploaded By: Montey Hora on 11/24/2014 11:38:02 Wound Measurements Length: (cm) 1.6 Width: (cm) 0.7 Depth: (cm) 0.2 Area: (cm) 0.88 Volume: (cm) 0.176 % Reduction in Area: 37.8% % Reduction in Volume: 37.8% Epithelialization: None Tunneling: No Undermining: No Wound Description Full Thickness Without Exposed Classification: Support Structures Wound Margin: Flat and Intact Exudate Medium Amount: Exudate Type: Serosanguineous Exudate Color: red, brown Foul Odor After Cleansing: No Wound Bed Granulation Amount: Medium (34-66%) Exposed Structure Granulation Quality: Red Fascia Exposed:  No Necrotic Amount: Medium (34-66%) Fat Layer Exposed: No GRAVIEL, PAYEUR A. ([id number]) Necrotic Quality: Adherent Slough Tendon Exposed: No Muscle Exposed: No Joint Exposed: No Bone Exposed: No Limited to Skin Breakdown Periwound Skin Texture Texture Color No Abnormalities Noted: No No Abnormalities Noted: No Callus: No Atrophie Blanche: No Crepitus: No Cyanosis: No Excoriation: No Ecchymosis: No Fluctuance: No Erythema: No Friable: No Hemosiderin Staining: No Induration: No Mottled: No Localized Edema: No Pallor: No Rash: No Rubor: No Scarring: No Temperature / Pain Moisture Temperature: No Abnormality No Abnormalities Noted: No Tenderness on Palpation: Yes Dry / Scaly:  No Maceration: No Moist: Yes Wound Preparation Ulcer Cleansing: Rinsed/Irrigated with Saline Topical Anesthetic Applied: Other: lidocaine 4%, Treatment Notes Wound #4 (Right, Anterior Lower Leg) 1. Cleansed with: Clean wound with Normal Saline 2. Anesthetic Topical Lidocaine 4% cream to wound bed prior to debridement 4. Dressing Applied: Mepitel 5. Secondary Dressing Applied Bordered Foam Dressing Dry Gauze Electronic Signature(s) Signed: 11/24/2014 8:45:43 AM By: Montey Hora Entered By: Montey Hora on 11/24/2014 08:45:43 [name] ([id number]) -------------------------------------------------------------------------------- Wound Assessment Details Patient Name: Richard Hidden A. Date of Service: 11/24/2014 8:15 AM Medical Record Number: [id number] Patient Account Number: [id number] Date of Birth/Sex: 14-Aug-1950 (64 y.o. Male) Treating RN: Montey Hora Primary Care Physician: Bluford Kaufmann Other Clinician: Referring Physician: Bluford Kaufmann Treating Physician/Extender: Frann Rider in Treatment: 1 Wound Status Wound Number: 5 Primary Etiology: Trauma, Other Wound Location: Right Lower Leg - Anterior, Wound Status: Open Distal Comorbid History: Neuropathy Wounding Event: Surgical Injury Date Acquired: 10/24/2014 Weeks Of Treatment: 1 Clustered Wound: No Photos Photo Uploaded By: Montey Hora on 11/24/2014 11:37:28 Wound Measurements Length: (cm) 1.2 Width: (cm) 0.9 Depth: (cm) 0.2 Area: (cm) 0.848 Volume: (cm) 0.17 % Reduction in Area: -28.5% % Reduction in Volume: -28.8% Epithelialization: None Tunneling: No Undermining: No Wound Description Full Thickness Without Exposed Classification: Support Structures Wound Margin: Distinct, outline attached Exudate Medium Amount: Exudate Type: Serosanguineous Exudate Color: red, brown Foul Odor After Cleansing: No Wound Bed Granulation Amount: Large (67-100%) Exposed  Structure Necrotic Amount: None Present (0%) Fascia Exposed: No Kardell, Silvester A. ([id number]) Fat Layer Exposed: No Tendon Exposed: No Muscle Exposed: No Joint Exposed: No Bone Exposed: No Limited to Skin Breakdown Periwound Skin Texture Texture Color No Abnormalities Noted: No No Abnormalities Noted: No Callus: No Atrophie Blanche: No Crepitus: No Cyanosis: No Excoriation: No Ecchymosis: No Fluctuance: No Erythema: No Friable: No Hemosiderin Staining: No Induration: No Mottled: No Localized Edema: Yes Pallor: No Rash: No Rubor: No Scarring: No Temperature / Pain Moisture Temperature: No Abnormality No Abnormalities Noted: No Tenderness on Palpation: Yes Dry / Scaly: No Maceration: No Moist: Yes Wound Preparation Ulcer Cleansing: Rinsed/Irrigated with Saline Topical Anesthetic Applied: Other: lidocaine 4%, Treatment Notes Wound #5 (Right, Distal, Anterior Lower Leg) 1. Cleansed with: Clean wound with Normal Saline 2. Anesthetic Topical Lidocaine 4% cream to wound bed prior to debridement 4. Dressing Applied: Mepitel 5. Secondary Dressing Applied Bordered Foam Dressing Dry Gauze Electronic Signature(s) Signed: 11/24/2014 8:46:19 AM By: Montey Hora Entered By: Montey Hora on 11/24/2014 08:46:19 [name] ([id number]) -------------------------------------------------------------------------------- [place] Details Patient Name: Richard Hidden A. Date of Service: 11/24/2014 8:15 AM Medical Record Number: [id number] Patient Account Number: [id number] Date of Birth/Sex: [birth year] (64 y.o. Male) Treating RN: Montey Hora Primary Care Physician: Bluford Kaufmann Other Clinician: Referring Physician: Bluford Kaufmann Treating Physician/Extender: Frann Rider in Treatment: 1 Vital Signs Time Taken: 08:23 Temperature (F): 98.4 Height (in): 68 Pulse (bpm): 66 Weight (lbs): 165  Respiratory Rate (breaths/min): 18 Body Mass Index  (BMI): 25.1 Blood Pressure (mmHg): 118/69 Reference Range: 80 - 120 mg / dl Electronic Signature(s) Signed: 11/24/2014 4:45:23 PM By: Montey Hora Entered By: Montey Hora on 11/24/2014 08:24:00

## 2014-11-25 NOTE — Progress Notes (Signed)
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 -------------------------------------------------------------------------------- SuperBill Details Patient Name: Richard Hidden A. Date of Service: 11/24/2014 Medical Record Number: [id number] Patient Account Number: [id number] Date of Birth/Sex: 1950/09/09 (64 y.o. Male) Treating RN: Primary Care Physician: Bluford Kaufmann Other Clinician: Referring Physician: Bluford Kaufmann Treating Physician/Extender: Frann Rider in Treatment: 1 Diagnosis Coding ICD-10 Codes Code Description [phone] Laceration without foreign body, right lower leg, initial encounter Disruption of external operation (surgical) wound, not elsewhere classified, initial T81.31XA encounter Facility Procedures CPT4: Description Modifier Quantity Code [id number] 11042 - DEB SUBQ TISSUE 20 SQ CM/< 1 ICD-10 Description Diagnosis  S81.811A Laceration without foreign body, right lower leg, initial encounter T81.31XA Disruption of external operation (surgical) wound,  not elsewhere classified, initial encounter Physician Procedures CPT4: Description Modifier Quantity Code [id number] 11042 - WC PHYS SUBQ TISS 20 SQ CM 1 ICD-10 Description Diagnosis S81.811A Laceration without foreign body, right lower leg, initial encounter T81.31XA Disruption of external operation (surgical) wound,  not elsewhere classified, initial encounter Electronic Signature(s) Signed: 11/24/2014 9:18:45 AM By: Christin Fudge MD, FACS Entered By: Christin Fudge on 11/24/2014 09:18:45

## 2014-12-01 ENCOUNTER — Encounter: Payer: BLUE CROSS/BLUE SHIELD | Admitting: Surgery

## 2014-12-01 DIAGNOSIS — S81811A Laceration without foreign body, right lower leg, initial encounter: Secondary | ICD-10-CM | POA: Diagnosis not present

## 2014-12-02 NOTE — Progress Notes (Signed)
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 initial encounter T81.31XA Disruption of external operation (surgical) wound, not  elsewhere classified, initial encounter Electronic Signature(s) Signed: 12/01/2014 5:42:00 PM By: Montey Hora Previous Signature: 12/01/2014 9:05:55 AM Version By: Christin Fudge MD, FACS Entered By: Montey Hora on 12/01/2014 17:42:00

## 2014-12-02 NOTE — Progress Notes (Signed)
GROVER, ROBINSON ([id number]) Visit Report for 12/01/2014 Arrival Information Details Patient Name: Richard [name], Richard [name]. Date of Service: 12/01/2014 8:15 AM Medical Record Number: [id number] Patient Account Number: [id number] Date of Birth/Sex: 1950-08-01 (64 y.o. Male) Treating RN: Montey Hora Primary Care Physician: Bluford Kaufmann Other Clinician: Referring Physician: Bluford Kaufmann Treating Physician/Extender: Frann Rider in Treatment: 2 Visit Information History Since Last Visit Added or deleted any medications: No Patient Arrived: Wheel Chair Any new allergies or adverse reactions: No Arrival Time: 08:22 Had a fall or experienced change in No Accompanied By: self activities of daily living that may affect Transfer Assistance: None risk of falls: Patient Identification Verified: Yes Signs or symptoms of abuse/neglect since last No Secondary Verification Process Yes visito Completed: Pain Present Now: No Patient Requires Transmission- No Based Precautions: Patient Has Alerts: Yes Patient Alerts: Patient on Blood Thinner Xarelto Electronic Signature(s) Signed: 12/01/2014 5:54:31 PM By: Montey Hora Entered By: Montey Hora on 12/01/2014 08:23:00 [name] ([id number]) -------------------------------------------------------------------------------- Clinic Level of Care Assessment Details Patient Name: Richard Hidden A. Date of Service: 12/01/2014 8:15 AM Medical Record Number: [id number] Patient Account Number: [id number] Date of Birth/Sex: 02-25-1951 (64 y.o. Male) Treating RN: Montey Hora Primary Care Physician: Bluford Kaufmann Other Clinician: Referring Physician: Bluford Kaufmann Treating Physician/Extender: Frann Rider in Treatment: 2 Clinic Level of Care Assessment Items TOOL 4 Quantity Score []  - Use when only an EandM is performed on FOLLOW-UP visit 0 ASSESSMENTS - Nursing Assessment / Reassessment X - Reassessment  of Co-morbidities (includes updates in patient status) 1 10 X - Reassessment of Adherence to Treatment Plan 1 5 ASSESSMENTS - Wound and Skin Assessment / Reassessment []  - Simple Wound Assessment / Reassessment - one wound 0 X - Complex Wound Assessment / Reassessment - multiple wounds 5 5 []  - Dermatologic / Skin Assessment (not related to wound area) 0 ASSESSMENTS - Focused Assessment X - Circumferential Edema Measurements - multi extremities 1 5 []  - Nutritional Assessment / Counseling / Intervention 0 X - Lower Extremity Assessment (monofilament, tuning fork, pulses) 1 5 []  - Peripheral Arterial Disease Assessment (using hand held doppler) 0 ASSESSMENTS - Ostomy and/or Continence Assessment and Care []  - Incontinence Assessment and Management 0 []  - Ostomy Care Assessment and Management (repouching, etc.) 0 PROCESS - Coordination of Care X - Simple Patient / Family Education for ongoing care 1 15 []  - Complex (extensive) Patient / Family Education for ongoing care 0 []  - Staff obtains Programmer, systems, Records, Test Results / Process Orders 0 []  - Staff telephones HHA, Nursing Homes / Clarify orders / etc 0 []  - Routine Transfer to another Facility (non-emergent condition) 0 Richard [name], [name] ([id number]) []  - Routine Hospital Admission (non-emergent condition) 0 []  - New Admissions / Biomedical engineer / Ordering NPWT, Apligraf, etc. 0 []  - Emergency Hospital Admission (emergent condition) 0 X - Simple Discharge Coordination 1 10 []  - Complex (extensive) Discharge Coordination 0 PROCESS - Special Needs []  - Pediatric / Minor Patient Management 0 []  - Isolation Patient Management 0 []  - Hearing / Language / Visual special needs 0 []  - Assessment of Community assistance (transportation, D/C planning, etc.) 0 []  - Additional assistance / Altered mentation 0 []  - Support Surface(s) Assessment (bed, cushion, seat, etc.) 0 INTERVENTIONS - Wound Cleansing / Measurement []  - Simple Wound  Cleansing - one wound 0 X - Complex Wound Cleansing - multiple wounds 5 5 X - Wound Imaging (photographs - any number of wounds) 1 5 []  - Wound Tracing (  instead of photographs) 0 []  - Simple Wound Measurement - one wound 0 X - Complex Wound Measurement - multiple wounds 5 5 INTERVENTIONS - Wound Dressings X - Small Wound Dressing one or multiple wounds 5 10 []  - Medium Wound Dressing one or multiple wounds 0 []  - Large Wound Dressing one or multiple wounds 0 []  - Application of Medications - topical 0 []  - Application of Medications - injection 0 INTERVENTIONS - Miscellaneous []  - External ear exam 0 Richard [name], [name] ([id number]) []  - Specimen Collection (cultures, biopsies, blood, body fluids, etc.) 0 []  - Specimen(s) / Culture(s) sent or taken to Lab for analysis 0 []  - Patient Transfer (multiple staff / Harrel Lemon Lift / Similar devices) 0 []  - Simple Staple / Suture removal (25 or less) 0 []  - Complex Staple / Suture removal (26 or more) 0 []  - Hypo / Hyperglycemic Management (close monitor of Blood Glucose) 0 []  - Ankle / Brachial Index (ABI) - do not check if billed separately 0 X - Vital Signs 1 5 Has the patient been seen at the hospital within the last three years: Yes Total Score: 185 Level Of Care: New/Established - Level 5 Electronic Signature(s) Signed: 12/01/2014 12:44:49 PM By: Montey Hora Entered By: Montey Hora on 12/01/2014 12:44:49 [name] ([id number]) -------------------------------------------------------------------------------- Encounter Discharge Information Details Patient Name: Richard Hidden A. Date of Service: 12/01/2014 8:15 AM Medical Record Number: [id number] Patient Account Number: [id number] Date of Birth/Sex: 1950-06-26 (64 y.o. Male) Treating RN: Montey Hora Primary Care Physician: Bluford Kaufmann Other Clinician: Referring Physician: Bluford Kaufmann Treating Physician/Extender: Frann Rider in Treatment: 2 Encounter  Discharge Information Items Discharge Pain Level: 0 Discharge Condition: Stable Ambulatory Status: Wheelchair Discharge Destination: Home Transportation: Private Auto Accompanied By: self Schedule Follow-up Appointment: Yes Medication Reconciliation completed and provided to Patient/Care No Richard [name]: Provided on Clinical Summary of Care: 12/01/2014 Form Type Recipient Paper Patient [facility] Electronic Signature(s) Signed: 12/01/2014 9:13:26 AM By: Ruthine Dose Entered By: Ruthine Dose on 12/01/2014 09:13:26 [name] ([id number]) -------------------------------------------------------------------------------- Lower Extremity Assessment Details Patient Name: Richard Hidden A. Date of Service: 12/01/2014 8:15 AM Medical Record Number: [id number] Patient Account Number: [id number] Date of Birth/Sex: [birth year] (64 y.o. Male) Treating RN: Montey Hora Primary Care Physician: Bluford Kaufmann Other Clinician: Referring Physician: Bluford Kaufmann Treating Physician/Extender: Frann Rider in Treatment: 2 Edema Assessment Assessed: Shirlyn Goltz: No] Patrice Paradise: No] Edema: [Left: Ye] [Right: s] Calf Left: Right: Point of Measurement: 32 cm From Medial Instep cm 35 cm Ankle Left: Right: Point of Measurement: 8 cm From Medial Instep cm 28.2 cm Vascular Assessment Pulses: Posterior Tibial Dorsalis Pedis Palpable: [Right:Yes] Extremity colors, hair growth, and conditions: Extremity Color: [Right:Mottled] Hair Growth on Extremity: [Right:Yes] Temperature of Extremity: [Right:Warm] Capillary Refill: [Right:< 3 seconds] Toe Nail Assessment Left: Right: Thick: No Discolored: No Deformed: No Improper Length and Hygiene: No Electronic Signature(s) Signed: 12/01/2014 5:54:31 PM By: Montey Hora Entered By: Montey Hora on 12/01/2014 08:36:08 [name] ([id number]) -------------------------------------------------------------------------------- Multi Wound Chart  Details Patient Name: Richard Hidden A. Date of Service: 12/01/2014 8:15 AM Medical Record Number: [id number] Patient Account Number: [id number] Date of Birth/Sex: [birth year] (64 y.o. Male) Treating RN: Montey Hora Primary Care Physician: Bluford Kaufmann Other Clinician: Referring Physician: Bluford Kaufmann Treating Physician/Extender: Frann Rider in Treatment: 2 Vital Signs Height(in): 68 Pulse(bpm): 72 Weight(lbs): 165 Blood Pressure 111/74 (mmHg): Body Mass Index(BMI): 25 Temperature(F): 98.6 Respiratory Rate 18 (breaths/min): Photos: [1:No Photos] [2:No Photos] [3:No Photos] Wound Location: [1:Right, Proximal, Anterior Lower Leg] [  2:Right, Medial, Anterior Lower Leg] [3:Right, Lateral Lower Leg] Wounding Event: [1:Surgical Injury] [2:Surgical Injury] [3:Surgical Injury] Primary Etiology: [1:Trauma, Other] [2:Trauma, Other] [3:Trauma, Other] Date Acquired: [1:10/24/2014] [2:10/24/2014] [3:10/24/2014] Weeks of Treatment: [1:2] [2:2] [3:2] Wound Status: [1:Open] [2:Open] [3:Open] Measurements L x W x D 1.4x0.9x0.1 [2:0.3x0.4x0.1] [3:1.8x1x0.3] (cm) Area (cm) : [1:0.99] [2:0.094] [3:1.414] Volume (cm) : [1:0.099] [2:0.009] [3:0.424] % Reduction in Area: [1:73.70%] [2:98.50%] [3:22.10%] % Reduction in Volume: 86.90% [2:99.30%] [3:-16.80%] Classification: [1:Full Thickness Without Exposed Support Structures] [2:Full Thickness Without Exposed Support Structures] [3:Full Thickness Without Exposed Support Structures] Periwound Skin Texture: No Abnormalities Noted [2:No Abnormalities Noted] [3:No Abnormalities Noted] Periwound Skin [1:No Abnormalities Noted] [2:No Abnormalities Noted] [3:No Abnormalities Noted] Moisture: Periwound Skin Color: No Abnormalities Noted [2:No Abnormalities Noted] [3:No Abnormalities Noted] Tenderness on [1:No] [2:No] [3:No] Wound Number: 4 5 N/A Photos: No Photos No Photos N/A Wound Location: Right, Anterior Lower Leg Right, Distal,  Anterior N/A Lower Leg Wounding Event: Surgical Injury Surgical Injury N/A Primary Etiology: Trauma, Other Trauma, Other N/A Richard [name], Richard [name] ([id number]) Date Acquired: 10/24/2014 10/24/2014 N/A Weeks of Treatment: 2 2 N/A Wound Status: Open Open N/A Measurements L x W x D 0.6x0.3x0.1 1.6x0.5x0.1 N/A (cm) Area (cm) : 0.141 0.628 N/A Volume (cm) : 0.014 0.063 N/A % Reduction in Area: 90.00% 4.80% N/A % Reduction in Volume: 95.10% 52.30% N/A Classification: Full Thickness Without Full Thickness Without N/A Exposed Support Exposed Support Structures Structures Periwound Skin Texture: No Abnormalities Noted No Abnormalities Noted N/A Periwound Skin No Abnormalities Noted No Abnormalities Noted N/A Moisture: Periwound Skin Color: No Abnormalities Noted No Abnormalities Noted N/A Tenderness on No No N/A Palpation: Treatment Notes Electronic Signature(s) Signed: 12/01/2014 5:54:31 PM By: Montey Hora Entered By: Montey Hora on 12/01/2014 08:42:26 [name] ([id number]) -------------------------------------------------------------------------------- [place] Details Patient Name: Richard Hidden A. Date of Service: 12/01/2014 8:15 AM Medical Record Number: [id number] Patient Account Number: [id number] Date of Birth/Sex: [birth year] (64 y.o. Male) Treating RN: Montey Hora Primary Care Physician: Bluford Kaufmann Other Clinician: Referring Physician: Bluford Kaufmann Treating Physician/Extender: Frann Rider in Treatment: 2 Active Inactive Orientation to the Wound Care Program Nursing Diagnoses: Knowledge deficit related to the wound healing center program Goals: Patient/caregiver will verbalize understanding of the [place] Program Date Initiated: 11/17/2014 Goal Status: Active Interventions: Provide education on orientation to the wound center Notes: Wound/Skin Impairment Nursing Diagnoses: Impaired tissue  integrity Knowledge deficit related to ulceration/compromised skin integrity Goals: Patient/caregiver will verbalize understanding of skin care regimen Date Initiated: 11/17/2014 Goal Status: Active Ulcer/skin breakdown will have a volume reduction of 30% by week 4 Date Initiated: 11/17/2014 Goal Status: Active Ulcer/skin breakdown will have a volume reduction of 50% by week 8 Date Initiated: 11/17/2014 Goal Status: Active Ulcer/skin breakdown will have a volume reduction of 80% by week 12 Date Initiated: 11/17/2014 Goal Status: Active Ulcer/skin breakdown will heal within 14 weeks Date Initiated: 11/17/2014 Richard [name], Richard [name] ([id number]) Goal Status: Active Interventions: Assess patient/caregiver ability to obtain necessary supplies Assess patient/caregiver ability to perform ulcer/skin care regimen upon admission and as needed Assess ulceration(s) every visit Provide education on ulcer and skin care Treatment Activities: Skin care regimen initiated : 12/01/2014 Topical wound management initiated : 12/01/2014 Notes: Electronic Signature(s) Signed: 12/01/2014 5:54:31 PM By: Montey Hora Entered By: Montey Hora on 12/01/2014 08:42:20 [name] ([id number]) -------------------------------------------------------------------------------- Patient/Caregiver Education Details Patient Name: Richard Hidden A. Date of Service: 12/01/2014 8:15 AM Medical Record Number: [id number] Patient Account Number: [id number] Date of Birth/Gender: [birth year] (63 y.o.  Male) Treating RN: Montey Hora Primary Care Physician: Bluford Kaufmann Other Clinician: Referring Physician: Bluford Kaufmann Treating Physician/Extender: Frann Rider in Treatment: 2 Education Assessment Education Provided To: Patient Education Topics Provided Wound/Skin Impairment: Handouts: Other: new wound care as ordered Methods: Demonstration, Explain/Verbal Responses: State content correctly Electronic  Signature(s) Signed: 12/01/2014 5:54:31 PM By: Montey Hora Entered By: Montey Hora on 12/01/2014 09:12:18 [name] ([id number]) -------------------------------------------------------------------------------- Wound Assessment Details Patient Name: Richard Hidden A. Date of Service: 12/01/2014 8:15 AM Medical Record Number: [id number] Patient Account Number: [id number] Date of Birth/Sex: 03-May-1950 (64 y.o. Male) Treating RN: Montey Hora Primary Care Physician: Bluford Kaufmann Other Clinician: Referring Physician: Bluford Kaufmann Treating Physician/Extender: Frann Rider in Treatment: 2 Wound Status Wound Number: 1 Primary Etiology: Trauma, Other Wound Location: Right Lower Leg - Anterior, Wound Status: Open Proximal Comorbid History: Neuropathy Wounding Event: Surgical Injury Date Acquired: 10/24/2014 Weeks Of Treatment: 2 Clustered Wound: No Photos Photo Uploaded By: Montey Hora on 12/01/2014 17:36:29 Wound Measurements Length: (cm) 1.4 Width: (cm) 0.9 Depth: (cm) 0.1 Area: (cm) 0.99 Volume: (cm) 0.099 % Reduction in Area: 73.7% % Reduction in Volume: 86.9% Epithelialization: None Tunneling: No Undermining: No Wound Description Full Thickness Without Exposed Classification: Support Structures Wound Margin: Distinct, outline attached Exudate Small Amount: Exudate Type: Serosanguineous Exudate Color: red, brown Foul Odor After Cleansing: No Wound Bed Granulation Amount: Large (67-100%) Exposed Structure Granulation Quality: Pink, Pale, Hyper-granulation Fascia Exposed: No Richard [name], [name] ([id number]) Necrotic Amount: None Present (0%) Fat Layer Exposed: No Tendon Exposed: No Muscle Exposed: No Joint Exposed: No Bone Exposed: No Limited to Skin Breakdown Periwound Skin Texture Texture Color No Abnormalities Noted: No No Abnormalities Noted: No Callus: No Atrophie Blanche: No Crepitus: No Cyanosis: No Excoriation:  No Ecchymosis: No Fluctuance: No Erythema: No Friable: No Hemosiderin Staining: No Induration: No Mottled: No Localized Edema: Yes Pallor: No Rash: No Rubor: No Scarring: No Temperature / Pain Moisture Temperature: No Abnormality No Abnormalities Noted: No Tenderness on Palpation: Yes Dry / Scaly: No Maceration: No Moist: Yes Wound Preparation Ulcer Cleansing: Rinsed/Irrigated with Saline Topical Anesthetic Applied: Other: lidocaine 4%, Treatment Notes Wound #1 (Right, Proximal, Anterior Lower Leg) 1. Cleansed with: Clean wound with Normal Saline 2. Anesthetic Topical Lidocaine 4% cream to wound bed prior to debridement 3. Peri-wound Care: Skin Prep 4. Dressing Applied: Mepitel 5. Secondary Dressing Applied Bordered Foam Dressing Dry Gauze 7. Secured with Tubigrip Electronic Signature(s) Signed: 12/01/2014 8:48:04 AM By: Salli Quarry, Thea Gist ([id number]) Entered By: Montey Hora on 12/01/2014 08:48:04 [name] ([id number]) -------------------------------------------------------------------------------- Wound Assessment Details Patient Name: Richard Hidden A. Date of Service: 12/01/2014 8:15 AM Medical Record Number: [id number] Patient Account Number: [id number] Date of Birth/Sex: 29-Nov-1950 (64 y.o. Male) Treating RN: Montey Hora Primary Care Physician: Bluford Kaufmann Other Clinician: Referring Physician: Bluford Kaufmann Treating Physician/Extender: Frann Rider in Treatment: 2 Wound Status Wound Number: 2 Primary Etiology: Trauma, Other Wound Location: Right Lower Leg - Medial, Wound Status: Open Anterior Comorbid History: Neuropathy Wounding Event: Surgical Injury Date Acquired: 10/24/2014 Weeks Of Treatment: 2 Clustered Wound: No Photos Photo Uploaded By: Montey Hora on 12/01/2014 17:35:49 Wound Measurements Length: (cm) 0.3 Width: (cm) 0.4 Depth: (cm) 0.1 Area: (cm) 0.094 Volume: (cm) 0.009 %  Reduction in Area: 98.5% % Reduction in Volume: 99.3% Epithelialization: Small (1-33%) Tunneling: No Undermining: No Wound Description Full Thickness Without Exposed Classification: Support Structures Wound Margin: Distinct, outline attached Exudate Small Amount: Exudate Type: Serosanguineous Exudate Color: red, brown Foul Odor After Cleansing:  No Wound Bed Granulation Amount: Large (67-100%) Exposed Structure Granulation Quality: Red, Pink Fascia Exposed: No Richard [name], [name] ([id number]) Necrotic Amount: None Present (0%) Fat Layer Exposed: No Tendon Exposed: No Muscle Exposed: No Joint Exposed: No Bone Exposed: No Limited to Skin Breakdown Periwound Skin Texture Texture Color No Abnormalities Noted: No No Abnormalities Noted: No Callus: No Atrophie Blanche: No Crepitus: No Cyanosis: No Excoriation: No Ecchymosis: No Fluctuance: No Erythema: No Friable: No Hemosiderin Staining: No Induration: No Mottled: No Localized Edema: Yes Pallor: No Rash: No Rubor: No Scarring: No Temperature / Pain Moisture Temperature: No Abnormality No Abnormalities Noted: No Dry / Scaly: No Maceration: No Moist: Yes Wound Preparation Ulcer Cleansing: Rinsed/Irrigated with Saline Topical Anesthetic Applied: None Treatment Notes Wound #2 (Right, Medial, Anterior Lower Leg) 1. Cleansed with: Clean wound with Normal Saline 2. Anesthetic Topical Lidocaine 4% cream to wound bed prior to debridement 3. Peri-wound Care: Skin Prep 4. Dressing Applied: Mepitel 5. Secondary Dressing Applied Bordered Foam Dressing Dry Gauze 7. Secured with Tubigrip Electronic Signature(s) Signed: 12/01/2014 8:48:30 AM By: Salli Quarry, Thea Gist ([id number]) Entered By: Montey Hora on 12/01/2014 08:48:30 [name] ([id number]) -------------------------------------------------------------------------------- Wound Assessment Details Patient Name: Richard Hidden A. Date of  Service: 12/01/2014 8:15 AM Medical Record Number: [id number] Patient Account Number: [id number] Date of Birth/Sex: [birth year] (64 y.o. Male) Treating RN: Montey Hora Primary Care Physician: Bluford Kaufmann Other Clinician: Referring Physician: Bluford Kaufmann Treating Physician/Extender: Frann Rider in Treatment: 2 Wound Status Wound Number: 3 Primary Etiology: Trauma, Other Wound Location: Right Lower Leg - Lateral Wound Status: Open Wounding Event: Surgical Injury Comorbid History: Neuropathy Date Acquired: 10/24/2014 Weeks Of Treatment: 2 Clustered Wound: No Photos Photo Uploaded By: Montey Hora on 12/01/2014 17:35:50 Wound Measurements Length: (cm) 1.8 Width: (cm) 1 Depth: (cm) 0.3 Area: (cm) 1.414 Volume: (cm) 0.424 % Reduction in Area: 22.1% % Reduction in Volume: -16.8% Epithelialization: None Tunneling: No Undermining: No Wound Description Full Thickness Without Exposed Classification: Support Structures Wound Margin: Distinct, outline attached Exudate Medium Amount: Exudate Type: Serosanguineous Exudate Color: red, brown Foul Odor After Cleansing: No Wound Bed Granulation Amount: Small (1-33%) Exposed Structure Granulation Quality: Red, Pink Fascia Exposed: No Necrotic Amount: Large (67-100%) Fat Layer Exposed: No Richard [name], [name] ([id number]) Necrotic Quality: Adherent Slough Tendon Exposed: No Muscle Exposed: No Joint Exposed: No Bone Exposed: No Limited to Skin Breakdown Periwound Skin Texture Texture Color No Abnormalities Noted: No No Abnormalities Noted: No Callus: No Atrophie Blanche: No Crepitus: No Cyanosis: No Excoriation: No Ecchymosis: No Fluctuance: No Erythema: No Friable: No Hemosiderin Staining: No Induration: No Mottled: No Localized Edema: Yes Pallor: No Rash: No Rubor: No Scarring: No Temperature / Pain Moisture Temperature: No Abnormality No Abnormalities Noted: No Tenderness on Palpation:  Yes Dry / Scaly: No Maceration: No Moist: Yes Wound Preparation Ulcer Cleansing: Rinsed/Irrigated with Saline Topical Anesthetic Applied: Other: lidocaine 4%, Treatment Notes Wound #3 (Right, Lateral Lower Leg) 1. Cleansed with: Clean wound with Normal Saline 2. Anesthetic Topical Lidocaine 4% cream to wound bed prior to debridement 3. Peri-wound Care: Skin Prep 4. Dressing Applied: Aquacel Ag 5. Secondary Dressing Applied Bordered Foam Dressing 7. Secured with Tubigrip Electronic Signature(s) Signed: 12/01/2014 8:48:50 AM By: Montey Hora Entered By: Montey Hora on 12/01/2014 08:48:50 [name] ([id number]) Emmit Alexanders, Thea Gist ([id number]) -------------------------------------------------------------------------------- Wound Assessment Details Patient Name: Richard Hidden A. Date of Service: 12/01/2014 8:15 AM Medical Record Number: [id number] Patient Account Number: [id number] Date of Birth/Sex: [birth year] (64 y.o. Male) Treating RN:  Montey Hora Primary Care Physician: Bluford Kaufmann Other Clinician: Referring Physician: Bluford Kaufmann Treating Physician/Extender: Frann Rider in Treatment: 2 Wound Status Wound Number: 4 Primary Etiology: Trauma, Other Wound Location: Right Lower Leg - Anterior Wound Status: Open Wounding Event: Surgical Injury Comorbid History: Neuropathy Date Acquired: 10/24/2014 Weeks Of Treatment: 2 Clustered Wound: No Photos Photo Uploaded By: Montey Hora on 12/01/2014 17:35:21 Wound Measurements Length: (cm) 0.6 Width: (cm) 0.3 Depth: (cm) 0.1 Area: (cm) 0.141 Volume: (cm) 0.014 % Reduction in Area: 90% % Reduction in Volume: 95.1% Epithelialization: None Tunneling: No Undermining: No Wound Description Full Thickness Without Exposed Classification: Support Structures Wound Margin: Flat and Intact Exudate Medium Amount: Exudate Type: Serosanguineous Exudate Color: red, brown Foul Odor After  Cleansing: No Wound Bed Granulation Amount: Large (67-100%) Exposed Structure Granulation Quality: Red Fascia Exposed: No Necrotic Amount: None Present (0%) Fat Layer Exposed: No Richard [name], [name] ([id number]) Tendon Exposed: No Muscle Exposed: No Joint Exposed: No Bone Exposed: No Limited to Skin Breakdown Periwound Skin Texture Texture Color No Abnormalities Noted: No No Abnormalities Noted: No Callus: No Atrophie Blanche: No Crepitus: No Cyanosis: No Excoriation: No Ecchymosis: No Fluctuance: No Erythema: No Friable: No Hemosiderin Staining: No Induration: No Mottled: No Localized Edema: No Pallor: No Rash: No Rubor: No Scarring: No Temperature / Pain Moisture Temperature: No Abnormality No Abnormalities Noted: No Tenderness on Palpation: Yes Dry / Scaly: No Maceration: No Moist: Yes Wound Preparation Ulcer Cleansing: Rinsed/Irrigated with Saline Topical Anesthetic Applied: None Treatment Notes Wound #4 (Right, Anterior Lower Leg) 1. Cleansed with: Clean wound with Normal Saline 2. Anesthetic Topical Lidocaine 4% cream to wound bed prior to debridement 3. Peri-wound Care: Skin Prep 4. Dressing Applied: Mepitel 5. Secondary Dressing Applied Bordered Foam Dressing Dry Gauze 7. Secured with Tubigrip Electronic Signature(s) Signed: 12/01/2014 8:49:12 AM By: Salli Quarry, Thea Gist ([id number]) Entered By: Montey Hora on 12/01/2014 08:49:11 [name] ([id number]) -------------------------------------------------------------------------------- Wound Assessment Details Patient Name: Richard Hidden A. Date of Service: 12/01/2014 8:15 AM Medical Record Number: [id number] Patient Account Number: [id number] Date of Birth/Sex: [birth year] (64 y.o. Male) Treating RN: Montey Hora Primary Care Physician: Bluford Kaufmann Other Clinician: Referring Physician: Bluford Kaufmann Treating Physician/Extender: Frann Rider in  Treatment: 2 Wound Status Wound Number: 5 Primary Etiology: Trauma, Other Wound Location: Right Lower Leg - Anterior, Wound Status: Open Distal Comorbid History: Neuropathy Wounding Event: Surgical Injury Date Acquired: 10/24/2014 Weeks Of Treatment: 2 Clustered Wound: No Photos Photo Uploaded By: Montey Hora on 12/01/2014 17:35:22 Wound Measurements Length: (cm) 1.6 Width: (cm) 0.5 Depth: (cm) 0.1 Area: (cm) 0.628 Volume: (cm) 0.063 % Reduction in Area: 4.8% % Reduction in Volume: 52.3% Epithelialization: None Tunneling: No Undermining: No Wound Description Full Thickness Without Exposed Classification: Support Structures Wound Margin: Distinct, outline attached Exudate Medium Amount: Exudate Type: Serosanguineous Exudate Color: red, brown Foul Odor After Cleansing: No Wound Bed Granulation Amount: Large (67-100%) Exposed Structure Necrotic Amount: None Present (0%) Fascia Exposed: No Richard [name], [name] ([id number]) Fat Layer Exposed: No Tendon Exposed: No Muscle Exposed: No Joint Exposed: No Bone Exposed: No Limited to Skin Breakdown Periwound Skin Texture Texture Color No Abnormalities Noted: No No Abnormalities Noted: No Callus: No Atrophie Blanche: No Crepitus: No Cyanosis: No Excoriation: No Ecchymosis: No Fluctuance: No Erythema: No Friable: No Hemosiderin Staining: No Induration: No Mottled: No Localized Edema: Yes Pallor: No Rash: No Rubor: No Scarring: No Temperature / Pain Moisture Temperature: No Abnormality No Abnormalities Noted: No Tenderness on Palpation: Yes Dry / Scaly:  No Maceration: No Moist: Yes Wound Preparation Ulcer Cleansing: Rinsed/Irrigated with Saline Topical Anesthetic Applied: None Treatment Notes Wound #5 (Right, Distal, Anterior Lower Leg) 1. Cleansed with: Clean wound with Normal Saline 2. Anesthetic Topical Lidocaine 4% cream to wound bed prior to debridement 3. Peri-wound Care: Skin Prep 4.  Dressing Applied: Mepitel 7. Secured with Tubigrip Electronic Signature(s) Signed: 12/01/2014 5:54:31 PM By: Montey Hora Entered By: Montey Hora on 12/01/2014 08:52:41 [name] ([id number]) Emmit Alexanders, Thea Gist ([id number]) -------------------------------------------------------------------------------- Vitals Details Patient Name: Richard Hidden A. Date of Service: 12/01/2014 8:15 AM Medical Record Number: [id number] Patient Account Number: [id number] Date of Birth/Sex: [birth year] (64 y.o. Male) Treating RN: Montey Hora Primary Care Physician: Bluford Kaufmann Other Clinician: Referring Physician: Bluford Kaufmann Treating Physician/Extender: Frann Rider in Treatment: 2 Vital Signs Time Taken: 08:23 Temperature (F): 98.6 Height (in): 68 Pulse (bpm): 72 Weight (lbs): 165 Respiratory Rate (breaths/min): 18 Body Mass Index (BMI): 25.1 Blood Pressure (mmHg): 111/74 Reference Range: 80 - 120 mg / dl Electronic Signature(s) Signed: 12/01/2014 5:54:31 PM By: Montey Hora Entered By: Montey Hora on 12/01/2014 21:62:44

## 2014-12-08 ENCOUNTER — Encounter: Payer: BLUE CROSS/BLUE SHIELD | Admitting: Surgery

## 2014-12-08 DIAGNOSIS — S81811A Laceration without foreign body, right lower leg, initial encounter: Secondary | ICD-10-CM | POA: Diagnosis not present

## 2014-12-09 NOTE — Progress Notes (Signed)
ERYX, ZANE ([id number]) Visit Report for 12/08/2014 Arrival Information Details Patient Name: Richard [name], Richard [name]. Date of Service: 12/08/2014 8:15 AM Medical Record Number: [id number] Patient Account Number: [id number] Date of Birth/Sex: 05/30/1950 (64 y.o. Male) Treating RN: Cornell Barman Primary Care Physician: Bluford Kaufmann Other Clinician: Referring Physician: Bluford Kaufmann Treating Physician/Extender: Frann Rider in Treatment: 3 Visit Information History Since Last Visit Added or deleted any medications: No Patient Arrived: Wheel Chair Any new allergies or adverse reactions: No Arrival Time: 08:14 Had a fall or experienced change in No Accompanied By: caregiver in activities of daily living that may affect lobby risk of falls: Transfer Assistance: None Signs or symptoms of abuse/neglect since last No Patient Identification Verified: Yes visito Secondary Verification Process Yes Hospitalized since last visit: No Completed: Pain Present Now: No Patient Requires Transmission- No Based Precautions: Patient Has Alerts: Yes Patient Alerts: Patient on Blood Thinner Xarelto Electronic Signature(s) Signed: 12/08/2014 5:52:12 PM By: Gretta Cool, RN, BSN, Kim RN, BSN Entered By: Gretta Cool, RN, BSN, Kim on 12/08/2014 08:16:27 Richard [name] ([id number]) -------------------------------------------------------------------------------- Clinic Level of Care Assessment Details Patient Name: Richard Hidden A. Date of Service: 12/08/2014 8:15 AM Medical Record Number: [id number] Patient Account Number: [id number] Date of Birth/Sex: 04-07-1951 (64 y.o. Male) Treating RN: Cornell Barman Primary Care Physician: Bluford Kaufmann Other Clinician: Referring Physician: Bluford Kaufmann Treating Physician/Extender: Frann Rider in Treatment: 3 Clinic Level of Care Assessment Items TOOL 4 Quantity Score []  - Use when only an EandM is performed on FOLLOW-UP visit  0 ASSESSMENTS - Nursing Assessment / Reassessment []  - Reassessment of Co-morbidities (includes updates in patient status) 0 []  - Reassessment of Adherence to Treatment Plan 0 ASSESSMENTS - Wound and Skin Assessment / Reassessment []  - Simple Wound Assessment / Reassessment - one wound 0 X - Complex Wound Assessment / Reassessment - multiple wounds 4 5 []  - Dermatologic / Skin Assessment (not related to wound area) 0 ASSESSMENTS - Focused Assessment []  - Circumferential Edema Measurements - multi extremities 0 []  - Nutritional Assessment / Counseling / Intervention 0 []  - Lower Extremity Assessment (monofilament, tuning fork, pulses) 0 []  - Peripheral Arterial Disease Assessment (using hand held doppler) 0 ASSESSMENTS - Ostomy and/or Continence Assessment and Care []  - Incontinence Assessment and Management 0 []  - Ostomy Care Assessment and Management (repouching, etc.) 0 PROCESS - Coordination of Care X - Simple Patient / Family Education for ongoing care 1 15 []  - Complex (extensive) Patient / Family Education for ongoing care 0 X - Staff obtains Programmer, systems, Records, Test Results / Process Orders 1 10 []  - Staff telephones HHA, Nursing Homes / Clarify orders / etc 0 []  - Routine Transfer to another Facility (non-emergent condition) 0 Richard [name], [name] ([id number]) []  - Routine Hospital Admission (non-emergent condition) 0 []  - New Admissions / Biomedical engineer / Ordering NPWT, Apligraf, etc. 0 []  - Emergency Hospital Admission (emergent condition) 0 X - Simple Discharge Coordination 1 10 []  - Complex (extensive) Discharge Coordination 0 PROCESS - Special Needs []  - Pediatric / Minor Patient Management 0 []  - Isolation Patient Management 0 []  - Hearing / Language / Visual special needs 0 []  - Assessment of Community assistance (transportation, D/C planning, etc.) 0 []  - Additional assistance / Altered mentation 0 []  - Support Surface(s) Assessment (bed, cushion, seat, etc.)  0 INTERVENTIONS - Wound Cleansing / Measurement []  - Simple Wound Cleansing - one wound 0 X - Complex Wound Cleansing - multiple wounds 4 5 []  - Wound Imaging (photographs -  any number of wounds) 0 X - Wound Tracing (instead of photographs) 1 5 []  - Simple Wound Measurement - one wound 0 X - Complex Wound Measurement - multiple wounds 4 5 INTERVENTIONS - Wound Dressings X - Small Wound Dressing one or multiple wounds 4 10 []  - Medium Wound Dressing one or multiple wounds 0 []  - Large Wound Dressing one or multiple wounds 0 []  - Application of Medications - topical 0 []  - Application of Medications - injection 0 INTERVENTIONS - Miscellaneous []  - External ear exam 0 Richard [name], [name] ([id number]) []  - Specimen Collection (cultures, biopsies, blood, body fluids, etc.) 0 []  - Specimen(s) / Culture(s) sent or taken to Lab for analysis 0 []  - Patient Transfer (multiple staff / Richard [name] / Similar devices) 0 []  - Simple Staple / Suture removal (25 or less) 0 []  - Complex Staple / Suture removal (26 or more) 0 []  - Hypo / Hyperglycemic Management (close monitor of Blood Glucose) 0 []  - Ankle / Brachial Index (ABI) - do not check if billed separately 0 X - Vital Signs 1 5 Has the patient been seen at the hospital within the last three years: Yes Total Score: 145 Level Of Care: New/Established - Level 4 Electronic Signature(s) Signed: 12/08/2014 5:52:12 PM By: Gretta Cool, RN, BSN, Kim RN, BSN Entered By: Gretta Cool, RN, BSN, Kim on 12/08/2014 08:53:58 Richard [name] ([id number]) -------------------------------------------------------------------------------- Encounter Discharge Information Details Patient Name: Richard Hidden A. Date of Service: 12/08/2014 8:15 AM Medical Record Number: [id number] Patient Account Number: [id number] Date of Birth/Sex: [birth year] (64 y.o. Male) Treating RN: Cornell Barman Primary Care Physician: Bluford Kaufmann Other Clinician: Referring Physician: Bluford Kaufmann Treating Physician/Extender: Frann Rider in Treatment: 3 Encounter Discharge Information Items Discharge Pain Level: 0 Discharge Condition: Stable Ambulatory Status: Wheelchair Discharge Destination: Nursing Home Transportation: Private Auto Accompanied By: caregivert Schedule Follow-up Appointment: Yes Medication Reconciliation completed and provided to Patient/Care Yes Richard [name]: Provided on Clinical Summary of Care: 12/08/2014 Form Type Recipient Paper Patient [facility] Electronic Signature(s) Signed: 12/08/2014 8:55:50 AM By: Ruthine Dose Entered By: Ruthine Dose on 12/08/2014 08:55:49 Richard [name] ([id number]) -------------------------------------------------------------------------------- Lower Extremity Assessment Details Patient Name: Richard Hidden A. Date of Service: 12/08/2014 8:15 AM Medical Record Number: [id number] Patient Account Number: [id number] Date of Birth/Sex: 12-21-1950 (63 y.o. Male) Treating RN: Cornell Barman Primary Care Physician: Bluford Kaufmann Other Clinician: Referring Physician: Bluford Kaufmann Treating Physician/Extender: Frann Rider in Treatment: 3 Edema Assessment Assessed: Shirlyn Goltz: No] [Right: No] E[Left: dema] [Right: :] Calf Left: Right: Point of Measurement: 32 cm From Medial Instep cm 38.5 cm Ankle Left: Right: Point of Measurement: 8 cm From Medial Instep cm 28.6 cm Vascular Assessment Pulses: Posterior Tibial Dorsalis Pedis Palpable: [Right:Yes] Extremity colors, hair growth, and conditions: Extremity Color: [Right:Mottled] Hair Growth on Extremity: [Right:Yes] Temperature of Extremity: [Right:Warm] Capillary Refill: [Right:< 3 seconds] Toe Nail Assessment Left: Right: Thick: No Discolored: No Deformed: No Improper Length and Hygiene: No Electronic Signature(s) Signed: 12/08/2014 5:52:12 PM By: Gretta Cool, RN, BSN, Kim RN, BSN Entered By: Gretta Cool, RN, BSN, Kim on 12/08/2014 35:59:74 Richard [name]  ([id number]) -------------------------------------------------------------------------------- Multi Wound Chart Details Patient Name: Richard Hidden A. Date of Service: 12/08/2014 8:15 AM Medical Record Number: [id number] Patient Account Number: [id number] Date of Birth/Sex: Feb 02, 1951 (65 y.o. Male) Treating RN: Cornell Barman Primary Care Physician: Bluford Kaufmann Other Clinician: Referring Physician: Bluford Kaufmann Treating Physician/Extender: Frann Rider in Treatment: 3 Vital Signs Height(in): 68 Pulse(bpm): 70 Weight(lbs): 165 Blood Pressure 121/77 (mmHg): Body Mass  Index(BMI): 25 Temperature(F): 98.2 Respiratory Rate 18 (breaths/min): Photos: [1:No Photos] [2:No Photos] [3:No Photos] Wound Location: [1:Right Lower Leg - Anterior, Proximal] [2:Right Lower Leg - Medial, Anterior] [3:Right Lower Leg - Lateral] Wounding Event: [1:Surgical Injury] [2:Surgical Injury] [3:Surgical Injury] Primary Etiology: [1:Trauma, Other] [2:Trauma, Other] [3:Trauma, Other] Comorbid History: [1:Neuropathy] [2:Neuropathy] [3:Neuropathy] Date Acquired: [1:10/24/2014] [2:10/24/2014] [3:10/24/2014] Weeks of Treatment: [1:3] [2:3] [3:3] Wound Status: [1:Open] [2:Healed - Epithelialized] [3:Open] Measurements L x W x D 0.5x0.4x0.1 [2:0x0x0] [3:1.5x0.7x0.2] (cm) Area (cm) : [1:0.157] [2:0] [3:0.825] Volume (cm) : [1:0.016] [2:0] [3:0.165] % Reduction in Area: [1:95.80%] [2:100.00%] [3:54.50%] % Reduction in Volume: 97.90% [2:100.00%] [3:54.50%] Classification: [1:Full Thickness Without Exposed Support Structures] [2:Full Thickness Without Exposed Support Structures] [3:Full Thickness Without Exposed Support Structures] Exudate Amount: [1:Small] [2:Small] [3:Medium] Exudate Type: [1:Serosanguineous] [2:Serosanguineous] [3:Serosanguineous] Exudate Color: [1:red, brown] [2:red, brown] [3:red, brown] Wound Margin: [1:Distinct, outline attached] [2:Distinct, outline attached] [3:Distinct,  outline attached] Granulation Amount: [1:Large (67-100%)] [2:Large (67-100%)] [3:Small (1-33%)] Granulation Quality: [1:Pink, Pale, Hyper- granulation] [2:Red, Pink] [3:Red, Pink] Necrotic Amount: [1:None Present (0%)] [2:None Present (0%)] [3:Large (67-100%)] Necrotic Tissue: [1:N/A] [2:N/A] [3:Adherent Slough] Exposed Structures: [1:Fascia: No Fat: No Tendon: No] [2:Fascia: No Fat: No Tendon: No] [3:Fascia: No Fat: No Tendon: No] Muscle: No Muscle: No Muscle: No Joint: No Joint: No Joint: No Bone: No Bone: No Bone: No Limited to Skin Limited to Skin Limited to Skin Breakdown Breakdown Breakdown Epithelialization: Large (67-100%) Large (67-100%) Small (1-33%) Periwound Skin Texture: Scarring: Yes Scarring: Yes Edema: Yes Edema: No Edema: No Scarring: Yes Excoriation: No Excoriation: No Excoriation: No Induration: No Induration: No Induration: No Callus: No Callus: No Callus: No Crepitus: No Crepitus: No Crepitus: No Fluctuance: No Fluctuance: No Fluctuance: No Friable: No Friable: No Friable: No Rash: No Rash: No Rash: No Periwound Skin Moist: Yes Maceration: No Moist: Yes Moisture: Maceration: No Moist: No Maceration: No Dry/Scaly: No Dry/Scaly: No Dry/Scaly: No Periwound Skin Color: Atrophie Blanche: No Atrophie Blanche: No Atrophie Blanche: No Cyanosis: No Cyanosis: No Cyanosis: No Ecchymosis: No Ecchymosis: No Ecchymosis: No Erythema: No Erythema: No Erythema: No Hemosiderin Staining: No Hemosiderin Staining: No Hemosiderin Staining: No Mottled: No Mottled: No Mottled: No Pallor: No Pallor: No Pallor: No Rubor: No Rubor: No Rubor: No Temperature: No Abnormality No Abnormality No Abnormality Tenderness on Yes No Yes Palpation: Wound Preparation: Ulcer Cleansing: Ulcer Cleansing: Ulcer Cleansing: Rinsed/Irrigated with Rinsed/Irrigated with Rinsed/Irrigated with Saline Saline Saline Topical Anesthetic Topical Anesthetic Applied:  None Applied: Other: lidocaine 4% Wound Number: 4 5 N/A Photos: No Photos No Photos N/A Wound Location: Right Lower Leg - Anterior Right Lower Leg - N/A Anterior, Distal Wounding Event: Surgical Injury Surgical Injury N/A Primary Etiology: Trauma, Other Trauma, Other N/A Comorbid History: Neuropathy Neuropathy N/A Date Acquired: 10/24/2014 10/24/2014 N/A Weeks of Treatment: 3 3 N/A Wound Status: Open Open N/A Measurements L x W x D 0.6x0.9x0.1 1.2x0.9x0.1 N/A (cm) Area (cm) : 0.424 0.848 N/A Richard [name], [name] ([id number]) Volume (cm) : 0.042 0.085 N/A % Reduction in Area: 70.00% -28.50% N/A % Reduction in Volume: 85.20% 35.60% N/A Classification: Full Thickness Without Full Thickness Without N/A Exposed Support Exposed Support Structures Structures Exudate Amount: Medium Medium N/A Exudate Type: Serosanguineous Serosanguineous N/A Exudate Color: red, brown red, brown N/A Wound Margin: Flat and Intact Distinct, outline attached N/A Granulation Amount: Large (67-100%) None Present (0%) N/A Granulation Quality: Red N/A N/A Necrotic Amount: None Present (0%) Large (67-100%) N/A Necrotic Tissue: N/A Eschar N/A Exposed Structures: Fascia: No Fascia: No N/A Fat: No  Fat: No Tendon: No Tendon: No Muscle: No Muscle: No Joint: No Joint: No Bone: No Bone: No Limited to Skin Limited to Skin Breakdown Breakdown Epithelialization: Small (1-33%) Small (1-33%) N/A Periwound Skin Texture: Edema: No Edema: Yes N/A Excoriation: No Excoriation: No Induration: No Induration: No Callus: No Callus: No Crepitus: No Crepitus: No Fluctuance: No Fluctuance: No Friable: No Friable: No Rash: No Rash: No Scarring: No Scarring: No Periwound Skin Moist: Yes Moist: Yes N/A Moisture: Maceration: No Maceration: No Dry/Scaly: No Dry/Scaly: No Periwound Skin Color: Atrophie Blanche: No Atrophie Blanche: No N/A Cyanosis: No Cyanosis: No Ecchymosis: No Ecchymosis: No Erythema:  No Erythema: No Hemosiderin Staining: No Hemosiderin Staining: No Mottled: No Mottled: No Pallor: No Pallor: No Rubor: No Rubor: No Temperature: No Abnormality No Abnormality N/A Tenderness on Yes Yes N/A Palpation: Wound Preparation: Ulcer Cleansing: Ulcer Cleansing: N/A Rinsed/Irrigated with Rinsed/Irrigated with Saline Saline Folds, Trae A. ([id number]) Topical Anesthetic Topical Anesthetic Applied: Other: lidocaine Applied: Other: lidocaine 4% 4% Treatment Notes Electronic Signature(s) Signed: 12/08/2014 5:52:12 PM By: Gretta Cool, RN, BSN, Kim RN, BSN Entered By: Gretta Cool, RN, BSN, Kim on 12/08/2014 08:39:42 Richard [name] ([id number]) -------------------------------------------------------------------------------- [place] Details Patient Name: Richard Hidden A. Date of Service: 12/08/2014 8:15 AM Medical Record Number: [id number] Patient Account Number: [id number] Date of Birth/Sex: May 28, 1950 (64 y.o. Male) Treating RN: Cornell Barman Primary Care Physician: Bluford Kaufmann Other Clinician: Referring Physician: Bluford Kaufmann Treating Physician/Extender: Frann Rider in Treatment: 3 Active Inactive Orientation to the Wound Care Program Nursing Diagnoses: Knowledge deficit related to the wound healing center program Goals: Patient/caregiver will verbalize understanding of the [place] Program Date Initiated: 11/17/2014 Goal Status: Active Interventions: Provide education on orientation to the wound center Notes: Wound/Skin Impairment Nursing Diagnoses: Impaired tissue integrity Knowledge deficit related to ulceration/compromised skin integrity Goals: Patient/caregiver will verbalize understanding of skin care regimen Date Initiated: 11/17/2014 Goal Status: Active Ulcer/skin breakdown will have a volume reduction of 30% by week 4 Date Initiated: 11/17/2014 Goal Status: Active Ulcer/skin breakdown will have a volume  reduction of 50% by week 8 Date Initiated: 11/17/2014 Goal Status: Active Ulcer/skin breakdown will have a volume reduction of 80% by week 12 Date Initiated: 11/17/2014 Goal Status: Active Ulcer/skin breakdown will heal within 14 weeks Date Initiated: 11/17/2014 Richard [name], Richard [name] ([id number]) Goal Status: Active Interventions: Assess patient/caregiver ability to obtain necessary supplies Assess patient/caregiver ability to perform ulcer/skin care regimen upon admission and as needed Assess ulceration(s) every visit Provide education on ulcer and skin care Treatment Activities: Skin care regimen initiated : 12/08/2014 Topical wound management initiated : 12/08/2014 Notes: Electronic Signature(s) Signed: 12/08/2014 5:52:12 PM By: Gretta Cool, RN, BSN, Kim RN, BSN Entered By: Gretta Cool, RN, BSN, Kim on 12/08/2014 08:39:35 Richard [name] ([id number]) -------------------------------------------------------------------------------- Pain Assessment Details Patient Name: Richard Hidden A. Date of Service: 12/08/2014 8:15 AM Medical Record Number: [id number] Patient Account Number: [id number] Date of Birth/Sex: August 14, 1950 (64 y.o. Male) Treating RN: Cornell Barman Primary Care Physician: Bluford Kaufmann Other Clinician: Referring Physician: Bluford Kaufmann Treating Physician/Extender: Frann Rider in Treatment: 3 Active Problems Location of Pain Severity and Description of Pain Patient Has Paino No Site Locations Pain Management and Medication Current Pain Management: Electronic Signature(s) Signed: 12/08/2014 5:52:12 PM By: Gretta Cool, RN, BSN, Kim RN, BSN Entered By: Gretta Cool, RN, BSN, Kim on 12/08/2014 08:16:32 Richard [name] ([id number]) -------------------------------------------------------------------------------- Patient/Caregiver Education Details Patient Name: Richard Hidden A. Date of Service: 12/08/2014 8:15 AM Medical Record Number: [id number] Patient Account Number: [id number] Date  of  Birth/Gender: 02-Jun-1950 (64 y.o. Male) Treating RN: Cornell Barman Primary Care Physician: Bluford Kaufmann Other Clinician: Referring Physician: Bluford Kaufmann Treating Physician/Extender: Frann Rider in Treatment: 3 Education Assessment Education Provided To: Patient Education Topics Provided Wound/Skin Impairment: Handouts: Caring for Your Ulcer, Other: continue wound care as prescribed Methods: Explain/Verbal Responses: State content correctly Electronic Signature(s) Signed: 12/08/2014 5:52:12 PM By: Gretta Cool, RN, BSN, Kim RN, BSN Entered By: Gretta Cool, RN, BSN, Kim on 12/08/2014 08:55:56 Richard [name] ([id number]) -------------------------------------------------------------------------------- Wound Assessment Details Patient Name: Richard Hidden A. Date of Service: 12/08/2014 8:15 AM Medical Record Number: [id number] Patient Account Number: [id number] Date of Birth/Sex: May 10, 1950 (64 y.o. Male) Treating RN: Cornell Barman Primary Care Physician: Bluford Kaufmann Other Clinician: Referring Physician: Bluford Kaufmann Treating Physician/Extender: Frann Rider in Treatment: 3 Wound Status Wound Number: 1 Primary Etiology: Trauma, Other Wound Location: Right Lower Leg - Anterior, Wound Status: Open Proximal Comorbid History: Neuropathy Wounding Event: Surgical Injury Date Acquired: 10/24/2014 Weeks Of Treatment: 3 Clustered Wound: No Photos Photo Uploaded By: Gretta Cool, RN, BSN, Kim on 12/08/2014 09:53:19 Wound Measurements Length: (cm) 0.5 Width: (cm) 0.4 Depth: (cm) 0.1 Area: (cm) 0.157 Volume: (cm) 0.016 % Reduction in Area: 95.8% % Reduction in Volume: 97.9% Epithelialization: Large (67-100%) Tunneling: No Undermining: No Wound Description Full Thickness Without Exposed Classification: Support Structures Wound Margin: Distinct, outline attached Exudate Small Amount: Exudate Type: Serosanguineous Exudate Color: red, brown Foul Odor  After Cleansing: No Wound Bed Granulation Amount: Large (67-100%) Exposed Structure Granulation Quality: Pink, Pale, Hyper-granulation Fascia Exposed: No Shaver, Dardan A. ([id number]) Necrotic Amount: None Present (0%) Fat Layer Exposed: No Tendon Exposed: No Muscle Exposed: No Joint Exposed: No Bone Exposed: No Limited to Skin Breakdown Periwound Skin Texture Texture Color No Abnormalities Noted: No No Abnormalities Noted: No Callus: No Atrophie Blanche: No Crepitus: No Cyanosis: No Excoriation: No Ecchymosis: No Fluctuance: No Erythema: No Friable: No Hemosiderin Staining: No Induration: No Mottled: No Localized Edema: No Pallor: No Rash: No Rubor: No Scarring: Yes Temperature / Pain Moisture Temperature: No Abnormality No Abnormalities Noted: No Tenderness on Palpation: Yes Dry / Scaly: No Maceration: No Moist: Yes Wound Preparation Ulcer Cleansing: Rinsed/Irrigated with Saline Treatment Notes Wound #1 (Right, Proximal, Anterior Lower Leg) 1. Cleansed with: Clean wound with Normal Saline 4. Dressing Applied: Mepitel 5. Secondary Dressing Applied Bordered Foam Dressing 7. Secured with Financial risk analyst) Signed: 12/08/2014 5:52:12 PM By: Gretta Cool, RN, BSN, Kim RN, BSN Entered By: Gretta Cool, RN, BSN, Kim on 12/08/2014 08:29:57 Richard [name] ([id number]) -------------------------------------------------------------------------------- Wound Assessment Details Patient Name: Richard Hidden A. Date of Service: 12/08/2014 8:15 AM Medical Record Number: [id number] Patient Account Number: [id number] Date of Birth/Sex: December 12, 1950 (64 y.o. Male) Treating RN: Cornell Barman Primary Care Physician: Bluford Kaufmann Other Clinician: Referring Physician: Bluford Kaufmann Treating Physician/Extender: Frann Rider in Treatment: 3 Wound Status Wound Number: 2 Primary Etiology: Trauma, Other Wound Location: Right Lower Leg - Medial, Wound Status:  Healed - Epithelialized Anterior Comorbid History: Neuropathy Wounding Event: Surgical Injury Date Acquired: 10/24/2014 Weeks Of Treatment: 3 Clustered Wound: No Photos Photo Uploaded By: Gretta Cool, RN, BSN, Kim on 12/08/2014 09:53:20 Wound Measurements Length: (cm) 0 % Reduction i Width: (cm) 0 % Reduction i Depth: (cm) 0 Epithelializa Area: (cm) 0 Tunneling: Volume: (cm) 0 Undermining: n Area: 100% n Volume: 100% tion: Large (67-100%) No No Wound Description Full Thickness Without Exposed Foul Odor Af Classification: Support Structures Wound Margin: Distinct, outline attached Exudate Small Amount: Exudate Type: Serosanguineous Exudate Color: red, brown ter Cleansing:  No Wound Bed Granulation Amount: Large (67-100%) Exposed Structure Granulation Quality: Red, Pink Fascia Exposed: No Richard [name], [name] ([id number]) Necrotic Amount: None Present (0%) Fat Layer Exposed: No Tendon Exposed: No Muscle Exposed: No Joint Exposed: No Bone Exposed: No Limited to Skin Breakdown Periwound Skin Texture Texture Color No Abnormalities Noted: No No Abnormalities Noted: No Callus: No Atrophie Blanche: No Crepitus: No Cyanosis: No Excoriation: No Ecchymosis: No Fluctuance: No Erythema: No Friable: No Hemosiderin Staining: No Induration: No Mottled: No Localized Edema: No Pallor: No Rash: No Rubor: No Scarring: Yes Temperature / Pain Moisture Temperature: No Abnormality No Abnormalities Noted: No Dry / Scaly: No Maceration: No Moist: No Wound Preparation Ulcer Cleansing: Rinsed/Irrigated with Saline Topical Anesthetic Applied: None Electronic Signature(s) Signed: 12/08/2014 5:52:12 PM By: Gretta Cool, RN, BSN, Kim RN, BSN Entered By: Gretta Cool, RN, BSN, Kim on 12/08/2014 08:30:39 Richard [name] ([id number]) -------------------------------------------------------------------------------- Wound Assessment Details Patient Name: Richard Hidden A. Date of Service:  12/08/2014 8:15 AM Medical Record Number: [id number] Patient Account Number: [id number] Date of Birth/Sex: [birth year] (64 y.o. Male) Treating RN: Cornell Barman Primary Care Physician: Bluford Kaufmann Other Clinician: Referring Physician: Bluford Kaufmann Treating Physician/Extender: Frann Rider in Treatment: 3 Wound Status Wound Number: 3 Primary Etiology: Trauma, Other Wound Location: Right Lower Leg - Lateral Wound Status: Open Wounding Event: Surgical Injury Comorbid History: Neuropathy Date Acquired: 10/24/2014 Weeks Of Treatment: 3 Clustered Wound: No Photos Photo Uploaded By: Gretta Cool, RN, BSN, Kim on 12/08/2014 09:54:14 Wound Measurements Length: (cm) 1.5 Width: (cm) 0.7 Depth: (cm) 0.2 Area: (cm) 0.825 Volume: (cm) 0.165 % Reduction in Area: 54.5% % Reduction in Volume: 54.5% Epithelialization: Small (1-33%) Tunneling: No Undermining: No Wound Description Full Thickness Without Exposed Classification: Support Structures Wound Margin: Distinct, outline attached Exudate Medium Amount: Exudate Type: Serosanguineous Exudate Color: red, brown Foul Odor After Cleansing: No Wound Bed Granulation Amount: Small (1-33%) Exposed Structure Granulation Quality: Red, Pink Fascia Exposed: No Necrotic Amount: Large (67-100%) Fat Layer Exposed: No Richard [name], [name] ([id number]) Necrotic Quality: Adherent Slough Tendon Exposed: No Muscle Exposed: No Joint Exposed: No Bone Exposed: No Limited to Skin Breakdown Periwound Skin Texture Texture Color No Abnormalities Noted: No No Abnormalities Noted: No Callus: No Atrophie Blanche: No Crepitus: No Cyanosis: No Excoriation: No Ecchymosis: No Fluctuance: No Erythema: No Friable: No Hemosiderin Staining: No Induration: No Mottled: No Localized Edema: Yes Pallor: No Rash: No Rubor: No Scarring: Yes Temperature / Pain Moisture Temperature: No Abnormality No Abnormalities Noted: No Tenderness on  Palpation: Yes Dry / Scaly: No Maceration: No Moist: Yes Wound Preparation Ulcer Cleansing: Rinsed/Irrigated with Saline Topical Anesthetic Applied: Other: lidocaine 4%, Treatment Notes Wound #3 (Right, Lateral Lower Leg) 1. Cleansed with: Clean wound with Normal Saline 2. Anesthetic Topical Lidocaine 4% cream to wound bed prior to debridement 4. Dressing Applied: Aquacel Ag 5. Secondary Dressing Applied Bordered Foam Dressing 7. Secured with Financial risk analyst) Signed: 12/08/2014 5:52:12 PM By: Gretta Cool, RN, BSN, Kim RN, BSN Entered By: Gretta Cool, RN, BSN, Kim on 12/08/2014 08:31:18 Richard [name] ([id number]) -------------------------------------------------------------------------------- Wound Assessment Details Patient Name: Richard Hidden A. Date of Service: 12/08/2014 8:15 AM Medical Record Number: [id number] Patient Account Number: [id number] Date of Birth/Sex: 10-04-1950 (64 y.o. Male) Treating RN: Cornell Barman Primary Care Physician: Bluford Kaufmann Other Clinician: Referring Physician: Bluford Kaufmann Treating Physician/Extender: Frann Rider in Treatment: 3 Wound Status Wound Number: 4 Primary Etiology: Trauma, Other Wound Location: Right Lower Leg - Anterior Wound Status: Open Wounding Event: Surgical Injury Comorbid History: Neuropathy Date Acquired:  10/24/2014 Weeks Of Treatment: 3 Clustered Wound: No Photos Photo Uploaded By: Gretta Cool, RN, BSN, Kim on 12/08/2014 09:54:15 Wound Measurements Length: (cm) 0.6 Width: (cm) 0.9 Depth: (cm) 0.1 Area: (cm) 0.424 Volume: (cm) 0.042 % Reduction in Area: 70% % Reduction in Volume: 85.2% Epithelialization: Small (1-33%) Wound Description Full Thickness Without Exposed Classification: Support Structures Wound Margin: Flat and Intact Exudate Medium Amount: Exudate Type: Serosanguineous Exudate Color: red, brown Foul Odor After Cleansing: No Wound Bed Granulation Amount: Large  (67-100%) Exposed Structure Granulation Quality: Red Fascia Exposed: No Necrotic Amount: None Present (0%) Fat Layer Exposed: No Richard [name], [name] ([id number]) Tendon Exposed: No Muscle Exposed: No Joint Exposed: No Bone Exposed: No Limited to Skin Breakdown Periwound Skin Texture Texture Color No Abnormalities Noted: No No Abnormalities Noted: No Callus: No Atrophie Blanche: No Crepitus: No Cyanosis: No Excoriation: No Ecchymosis: No Fluctuance: No Erythema: No Friable: No Hemosiderin Staining: No Induration: No Mottled: No Localized Edema: No Pallor: No Rash: No Rubor: No Scarring: No Temperature / Pain Moisture Temperature: No Abnormality No Abnormalities Noted: No Tenderness on Palpation: Yes Dry / Scaly: No Maceration: No Moist: Yes Wound Preparation Ulcer Cleansing: Rinsed/Irrigated with Saline Topical Anesthetic Applied: Other: lidocaine 4%, Treatment Notes Wound #4 (Right, Anterior Lower Leg) 1. Cleansed with: Clean wound with Normal Saline 4. Dressing Applied: Mepitel 5. Secondary Dressing Applied Bordered Foam Dressing 7. Secured with Financial risk analyst) Signed: 12/08/2014 5:52:12 PM By: Gretta Cool, RN, BSN, Kim RN, BSN Entered By: Gretta Cool, RN, BSN, Kim on 12/08/2014 08:32:23 Richard [name] ([id number]) -------------------------------------------------------------------------------- Wound Assessment Details Patient Name: Richard Hidden A. Date of Service: 12/08/2014 8:15 AM Medical Record Number: [id number] Patient Account Number: [id number] Date of Birth/Sex: 02/18/1951 (64 y.o. Male) Treating RN: Cornell Barman Primary Care Physician: Bluford Kaufmann Other Clinician: Referring Physician: Bluford Kaufmann Treating Physician/Extender: Frann Rider in Treatment: 3 Wound Status Wound Number: 5 Primary Etiology: Trauma, Other Wound Location: Right Lower Leg - Anterior, Wound Status: Open Distal Comorbid History:  Neuropathy Wounding Event: Surgical Injury Date Acquired: 10/24/2014 Weeks Of Treatment: 3 Clustered Wound: No Photos Photo Uploaded By: Gretta Cool, RN, BSN, Kim on 12/08/2014 09:55:12 Wound Measurements Length: (cm) 1.2 Width: (cm) 0.9 Depth: (cm) 0.1 Area: (cm) 0.848 Volume: (cm) 0.085 % Reduction in Area: -28.5% % Reduction in Volume: 35.6% Epithelialization: Small (1-33%) Wound Description Full Thickness Without Exposed Classification: Support Structures Wound Margin: Distinct, outline attached Exudate Medium Amount: Exudate Type: Serosanguineous Exudate Color: red, brown Foul Odor After Cleansing: No Wound Bed Granulation Amount: None Present (0%) Exposed Structure Necrotic Amount: Large (67-100%) Fascia Exposed: No Richard [name], [name] ([id number]) Necrotic Quality: Eschar Fat Layer Exposed: No Tendon Exposed: No Muscle Exposed: No Joint Exposed: No Bone Exposed: No Limited to Skin Breakdown Periwound Skin Texture Texture Color No Abnormalities Noted: No No Abnormalities Noted: No Callus: No Atrophie Blanche: No Crepitus: No Cyanosis: No Excoriation: No Ecchymosis: No Fluctuance: No Erythema: No Friable: No Hemosiderin Staining: No Induration: No Mottled: No Localized Edema: Yes Pallor: No Rash: No Rubor: No Scarring: No Temperature / Pain Moisture Temperature: No Abnormality No Abnormalities Noted: No Tenderness on Palpation: Yes Dry / Scaly: No Maceration: No Moist: Yes Wound Preparation Ulcer Cleansing: Rinsed/Irrigated with Saline Topical Anesthetic Applied: Other: lidocaine 4%, Treatment Notes Wound #5 (Right, Distal, Anterior Lower Leg) 1. Cleansed with: Clean wound with Normal Saline 4. Dressing Applied: Mepitel 5. Secondary Dressing Applied Bordered Foam Dressing 7. Secured with Financial risk analyst) Signed: 12/08/2014 5:52:12 PM By: Gretta Cool, RN, BSN, Kim RN, BSN  Entered By: Gretta Cool, RN, BSN, Kim on 12/08/2014  08:33:15 Richard [name] ([id number]) -------------------------------------------------------------------------------- [place] Details Patient Name: Richard Hidden A. Date of Service: 12/08/2014 8:15 AM Medical Record Number: [id number] Patient Account Number: [id number] Date of Birth/Sex: 12-05-1950 (64 y.o. Male) Treating RN: Cornell Barman Primary Care Physician: Bluford Kaufmann Other Clinician: Referring Physician: Bluford Kaufmann Treating Physician/Extender: Frann Rider in Treatment: 3 Vital Signs Time Taken: 08:15 Temperature (F): 98.2 Height (in): 68 Pulse (bpm): 70 Weight (lbs): 165 Respiratory Rate (breaths/min): 18 Body Mass Index (BMI): 25.1 Blood Pressure (mmHg): 121/77 Reference Range: 80 - 120 mg / dl Electronic Signature(s) Signed: 12/08/2014 5:52:12 PM By: Gretta Cool, RN, BSN, Kim RN, BSN Entered By: Gretta Cool, RN, BSN, Kim on 12/08/2014 08:20:45
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
## 2014-12-15 ENCOUNTER — Encounter (HOSPITAL_BASED_OUTPATIENT_CLINIC_OR_DEPARTMENT_OTHER): Payer: BLUE CROSS/BLUE SHIELD | Admitting: General Surgery

## 2014-12-15 DIAGNOSIS — S81811A Laceration without foreign body, right lower leg, initial encounter: Secondary | ICD-10-CM | POA: Diagnosis not present

## 2014-12-15 DIAGNOSIS — L97922 Non-pressure chronic ulcer of unspecified part of left lower leg with fat layer exposed: Secondary | ICD-10-CM | POA: Diagnosis not present
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
## 2014-12-15 NOTE — Progress Notes (Signed)
See i heal 

## 2014-12-15 NOTE — Progress Notes (Signed)
Richard [name], Richard [name] ([id number]) Visit Report for 12/15/2014 Arrival Information Details Patient Name: Richard [name], Richard [name]. Date of Service: 12/15/2014 8:00 AM Medical Record Number: [id number] Patient Account Number: [id number] Date of Birth/Sex: Jun 20, 1950 (64 y.o. Male) Treating RN: Montey Hora Primary Care Physician: Bluford Kaufmann Other Clinician: Referring Physician: Bluford Kaufmann Treating Physician/Extender: Benjaman Pott in Treatment: 4 Visit Information History Since Last Visit Added or deleted any medications: No Patient Arrived: Wheel Chair Any new allergies or adverse reactions: No Arrival Time: 08:09 Had a fall or experienced change in No Accompanied By: self activities of daily living that may affect Transfer Assistance: None risk of falls: Patient Identification Verified: Yes Signs or symptoms of abuse/neglect since last No Secondary Verification Process Yes visito Completed: Hospitalized since last visit: No Patient Requires Transmission- No Pain Present Now: No Based Precautions: Patient Has Alerts: Yes Patient Alerts: Patient on Blood Thinner Xarelto Electronic Signature(s) Signed: 12/15/2014 9:47:56 AM By: Judene Companion MD Entered By: Judene Companion on 12/15/2014 09:47:56 Richard [name] ([id number]) -------------------------------------------------------------------------------- Clinic Level of Care Assessment Details Patient Name: Richard Hidden A. Date of Service: 12/15/2014 8:00 AM Medical Record Number: [id number] Patient Account Number: [id number] Date of Birth/Sex: [birth year] (64 y.o. Male) Treating RN: Montey Hora Primary Care Physician: Bluford Kaufmann Other Clinician: Referring Physician: Bluford Kaufmann Treating Physician/Extender: Benjaman Pott in Treatment: 4 Clinic Level of Care Assessment Items TOOL 1 Quantity Score []  - Use when EandM and Procedure is performed on INITIAL visit 0 ASSESSMENTS - Nursing  Assessment / Reassessment []  - General Physical Exam (combine w/ comprehensive assessment (listed just 0 below) when performed on new pt. evals) []  - Comprehensive Assessment (HX, ROS, Risk Assessments, Wounds Hx, etc.) 0 ASSESSMENTS - Wound and Skin Assessment / Reassessment []  - Dermatologic / Skin Assessment (not related to wound area) 0 ASSESSMENTS - Ostomy and/or Continence Assessment and Care []  - Incontinence Assessment and Management 0 []  - Ostomy Care Assessment and Management (repouching, etc.) 0 PROCESS - Coordination of Care []  - Simple Patient / Family Education for ongoing care 0 []  - Complex (extensive) Patient / Family Education for ongoing care 0 []  - Staff obtains Programmer, systems, Records, Test Results / Process Orders 0 []  - Staff telephones HHA, Nursing Homes / Clarify orders / etc 0 []  - Routine Transfer to another Facility (non-emergent condition) 0 []  - Routine Hospital Admission (non-emergent condition) 0 []  - New Admissions / Biomedical engineer / Ordering NPWT, Apligraf, etc. 0 []  - Emergency Hospital Admission (emergent condition) 0 PROCESS - Special Needs []  - Pediatric / Minor Patient Management 0 []  - Isolation Patient Management 0 [name], [name] ([id number]) []  - Hearing / Language / Visual special needs 0 []  - Assessment of Community assistance (transportation, D/C planning, etc.) 0 []  - Additional assistance / Altered mentation 0 []  - Support Surface(s) Assessment (bed, cushion, seat, etc.) 0 INTERVENTIONS - Miscellaneous []  - External ear exam 0 []  - Patient Transfer (multiple staff / Civil Service fast streamer / Similar devices) 0 []  - Simple Staple / Suture removal (25 or less) 0 []  - Complex Staple / Suture removal (26 or more) 0 []  - Hypo/Hyperglycemic Management (do not check if billed separately) 0 []  - Ankle / Brachial Index (ABI) - do not check if billed separately 0 Has the patient been seen at the hospital within the last three years: Yes Total Score: 0  Level Of Care: ____ Electronic Signature(s) Signed: 12/15/2014 9:50:45 AM By: Judene Companion MD Entered By: Judene Companion on 12/15/2014 09:50:45 [name],  Richard [name] ([id number]) -------------------------------------------------------------------------------- Encounter Discharge Information Details Patient Name: [name], [name] Date of Service: 12/15/2014 8:00 AM Medical Record Number: [id number] Patient Account Number: [id number] Date of Birth/Sex: January 04, 1951 (64 y.o. Male) Treating RN: Montey Hora Primary Care Physician: Bluford Kaufmann Other Clinician: Referring Physician: Bluford Kaufmann Treating Physician/Extender: Benjaman Pott in Treatment: 4 Encounter Discharge Information Items Discharge Pain Level: 0 Discharge Condition: Stable Ambulatory Status: Wheelchair Discharge Destination: Home Transportation: Private Auto Accompanied By: self Schedule Follow-up Appointment: Yes Medication Reconciliation completed and provided to Patient/Care No Richard [name]: Provided on Clinical Summary of Care: 12/15/2014 Form Type Recipient Paper Patient [facility] Electronic Signature(s) Signed: 12/15/2014 9:54:08 AM By: Judene Companion MD Previous Signature: 12/15/2014 8:58:49 AM Version By: Ruthine Dose Entered By: Judene Companion on 12/15/2014 09:54:08 Richard [name] ([id number]) -------------------------------------------------------------------------------- Lower Extremity Assessment Details Patient Name: Richard Hidden A. Date of Service: 12/15/2014 8:00 AM Medical Record Number: [id number] Patient Account Number: [id number] Date of Birth/Sex: 11-Oct-1950 (64 y.o. Male) Treating RN: Montey Hora Primary Care Physician: Bluford Kaufmann Other Clinician: Referring Physician: Bluford Kaufmann Treating Physician/Extender: Judene Companion Weeks in Treatment: 4 Edema Assessment Assessed: [Left: No] [Right: No] Edema: [Left: Ye] [Right: s] Calf Left: Right: Point of Measurement:  32 cm From Medial Instep cm 39.1 cm Ankle Left: Right: Point of Measurement: 8 cm From Medial Instep cm 29 cm Vascular Assessment Pulses: Posterior Tibial Dorsalis Pedis Palpable: [Right:Yes] Extremity colors, hair growth, and conditions: Extremity Color: [Right:Mottled] Hair Growth on Extremity: [Right:Yes] Temperature of Extremity: [Right:Warm] Capillary Refill: [Right:< 3 seconds] Toe Nail Assessment Left: Right: Thick: No Discolored: No Deformed: No Improper Length and Hygiene: No Electronic Signature(s) Signed: 12/15/2014 9:49:19 AM By: Judene Companion MD Signed: 12/15/2014 2:50:48 PM By: Montey Hora Entered By: Judene Companion on 12/15/2014 09:49:19 Richard [name] ([id number]) Richard [name], Richard Gist ([id number]) -------------------------------------------------------------------------------- Multi Wound Chart Details Patient Name: Richard Hidden A. Date of Service: 12/15/2014 8:00 AM Medical Record Number: [id number] Patient Account Number: [id number] Date of Birth/Sex: 10-15-1950 (64 y.o. Male) Treating RN: Montey Hora Primary Care Physician: Bluford Kaufmann Other Clinician: Referring Physician: Bluford Kaufmann Treating Physician/Extender: Judene Companion Weeks in Treatment: 4 Vital Signs Height(in): 68 Pulse(bpm): 70 Weight(lbs): 165 Blood Pressure 119/67 (mmHg): Body Mass Index(BMI): 25 Temperature(F): 98.5 Respiratory Rate 18 (breaths/min): Photos: [1:No Photos] [3:No Photos] [4:No Photos] Wound Location: [1:Right, Proximal, Anterior Lower Leg] [3:Right Lower Leg - Lateral Right, Anterior Lower Leg] Wounding Event: [1:Surgical Injury] [3:Surgical Injury] [4:Surgical Injury] Primary Etiology: [1:Trauma, Other] [3:Trauma, Other] [4:Trauma, Other] Comorbid History: [1:N/A] [3:Neuropathy] [4:N/A] Date Acquired: [1:10/24/2014] [3:10/24/2014] [4:10/24/2014] Weeks of Treatment: [1:4] [3:4] [4:4] Wound Status: [1:Healed - Epithelialized] [3:Open] [4:Healed -  Epithelialized] Measurements L x W x D 0x0x0 [3:1.5x0.7x0.2] [4:0x0x0] (cm) Area (cm) : [1:0] [3:0.825] [4:0] Volume (cm) : [1:0] [3:0.165] [4:0] % Reduction in Area: [1:100.00%] [3:54.50%] [4:100.00%] % Reduction in Volume: 100.00% [3:54.50%] [4:100.00%] Classification: [1:Full Thickness Without Exposed Support Structures] [3:Full Thickness Without Exposed Support Structures] [4:Full Thickness Without Exposed Support Structures] Exudate Amount: [1:N/A] [3:Medium] [4:N/A] Exudate Type: [1:N/A] [3:Serosanguineous] [4:N/A] Exudate Color: [1:N/A] [3:red, brown] [4:N/A] Wound Margin: [1:N/A] [3:Distinct, outline attached] [4:N/A] Granulation Amount: [1:N/A] [3:Medium (34-66%)] [4:N/A] Granulation Quality: [1:N/A] [3:Red, Pink] [4:N/A] Necrotic Amount: [1:N/A] [3:Medium (34-66%)] [4:N/A] Epithelialization: [1:N/A] [3:Small (1-33%)] [4:N/A] Debridement: [1:N/A] [3:Debridement ([id number])] [4:N/A] Time-Out Taken: [1:N/A] [3:Yes] [4:N/A] Pain Control: N/A Lidocaine 4% Topical N/A Solution Tissue Debrided: N/A Fibrin/Slough, N/A Subcutaneous Level: N/A Skin/Subcutaneous N/A Tissue Debridement Area (sq N/A 1.05 N/A cm): Instrument: N/A Curette N/A Bleeding: N/A Minimum N/A Hemostasis Achieved:  N/A Pressure N/A Procedural Pain: N/A 0 N/A Post Procedural Pain: N/A 0 N/A Debridement Treatment N/A Procedure was tolerated N/A Response: well Post Debridement N/A 1.5x0.7x0.2 N/A Measurements L x W x D (cm) Post Debridement N/A 0.165 N/A Volume: (cm) Periwound Skin Texture: No Abnormalities Noted Edema: Yes No Abnormalities Noted Scarring: Yes Excoriation: No Induration: No Callus: No Crepitus: No Fluctuance: No Friable: No Rash: No Periwound Skin No Abnormalities Noted Moist: Yes No Abnormalities Noted Moisture: Maceration: No Dry/Scaly: No Periwound Skin Color: No Abnormalities Noted Atrophie Blanche: No No Abnormalities Noted Cyanosis: No Ecchymosis: No Erythema:  No Hemosiderin Staining: No Mottled: No Pallor: No Rubor: No Temperature: N/A No Abnormality N/A Tenderness on No Yes No Palpation: Wound Preparation: N/A Ulcer Cleansing: N/A Rinsed/Irrigated with Saline Topical Anesthetic Applied: Other: lidocaine 4% Richard [name], GRIVAS. ([id number]) Procedures Performed: N/A Debridement N/A Wound Number: 5 N/A N/A Photos: No Photos N/A N/A Wound Location: Right, Distal, Anterior N/A N/A Lower Leg Wounding Event: Surgical Injury N/A N/A Primary Etiology: Trauma, Other N/A N/A Comorbid History: N/A N/A N/A Date Acquired: 10/24/2014 N/A N/A Weeks of Treatment: 4 N/A N/A Wound Status: Healed - Epithelialized N/A N/A Measurements L x W x D 0x0x0 N/A N/A (cm) Area (cm) : 0 N/A N/A Volume (cm) : 0 N/A N/A % Reduction in Area: 100.00% N/A N/A % Reduction in Volume: 100.00% N/A N/A Classification: Full Thickness Without N/A N/A Exposed Support Structures Exudate Amount: N/A N/A N/A Exudate Type: N/A N/A N/A Exudate Color: N/A N/A N/A Wound Margin: N/A N/A N/A Granulation Amount: N/A N/A N/A Granulation Quality: N/A N/A N/A Necrotic Amount: N/A N/A N/A Exposed Structures: N/A N/A N/A Epithelialization: N/A N/A N/A Debridement: N/A N/A N/A Time-Out Taken: N/A N/A N/A Pain Control: N/A N/A N/A Tissue Debrided: N/A N/A N/A Level: N/A N/A N/A Debridement Area (sq N/A N/A N/A cm): Instrument: N/A N/A N/A Bleeding: N/A N/A N/A Hemostasis Achieved: N/A N/A N/A Procedural Pain: N/A N/A N/A Post Procedural Pain: N/A N/A N/A Debridement Treatment N/A N/A N/A Response: Post Debridement N/A N/A N/A Measurements L x W x D (cm) Post Debridement N/A N/A N/A Volume: (cm) [name], [name] ([id number]) Periwound Skin Texture: No Abnormalities Noted N/A N/A Periwound Skin No Abnormalities Noted N/A N/A Moisture: Periwound Skin Color: No Abnormalities Noted N/A N/A Temperature: N/A N/A N/A Tenderness on No N/A N/A Palpation: Wound  Preparation: N/A N/A N/A Procedures Performed: N/A N/A N/A Treatment Notes Wound #3 (Right, Lateral Lower Leg) 1. Cleansed with: Clean wound with Normal Saline 2. Anesthetic Topical Lidocaine 4% cream to wound bed prior to debridement 4. Dressing Applied: Aquacel Ag 5. Secondary Dressing Applied Bordered Foam Dressing 6. Footwear/Offloading device applied Tubigrip Electronic Signature(s) Signed: 12/15/2014 9:49:56 AM By: Judene Companion MD Entered By: Judene Companion on 12/15/2014 09:49:56 Richard [name] ([id number]) -------------------------------------------------------------------------------- [place] Details Patient Name: Richard Hidden A. Date of Service: 12/15/2014 8:00 AM Medical Record Number: [id number] Patient Account Number: [id number] Date of Birth/Sex: [birth year] (64 y.o. Male) Treating RN: Montey Hora Primary Care Physician: Bluford Kaufmann Other Clinician: Referring Physician: Bluford Kaufmann Treating Physician/Extender: Benjaman Pott in Treatment: 4 Active Inactive Orientation to the Wound Care Program Nursing Diagnoses: Knowledge deficit related to the wound healing center program Goals: Patient/caregiver will verbalize understanding of the [place] Program Date Initiated: 11/17/2014 Goal Status: Active Interventions: Provide education on orientation to the wound center Notes: Wound/Skin Impairment Nursing Diagnoses: Impaired tissue integrity Knowledge deficit related to ulceration/compromised skin integrity Goals: Patient/caregiver  will verbalize understanding of skin care regimen Date Initiated: 11/17/2014 Goal Status: Active Ulcer/skin breakdown will have a volume reduction of 30% by week 4 Date Initiated: 11/17/2014 Goal Status: Active Ulcer/skin breakdown will have a volume reduction of 50% by week 8 Date Initiated: 11/17/2014 Goal Status: Active Ulcer/skin breakdown will have a volume reduction of 80% by  week 12 Date Initiated: 11/17/2014 Goal Status: Active Ulcer/skin breakdown will heal within 14 weeks Date Initiated: 11/17/2014 ULES, MARSALA ([id number]) Goal Status: Active Interventions: Assess patient/caregiver ability to obtain necessary supplies Assess patient/caregiver ability to perform ulcer/skin care regimen upon admission and as needed Assess ulceration(s) every visit Provide education on ulcer and skin care Treatment Activities: Skin care regimen initiated : 12/15/2014 Topical wound management initiated : 12/15/2014 Notes: Electronic Signature(s) Signed: 12/15/2014 9:49:37 AM By: Judene Companion MD Signed: 12/15/2014 2:50:48 PM By: Montey Hora Entered By: Judene Companion on 12/15/2014 09:49:37 Richard [name] ([id number]) -------------------------------------------------------------------------------- Pain Assessment Details Patient Name: Richard Hidden A. Date of Service: 12/15/2014 8:00 AM Medical Record Number: [id number] Patient Account Number: [id number] Date of Birth/Sex: 11-20-1950 (64 y.o. Male) Treating RN: Montey Hora Primary Care Physician: Bluford Kaufmann Other Clinician: Referring Physician: Bluford Kaufmann Treating Physician/Extender: Benjaman Pott in Treatment: 4 Active Problems Location of Pain Severity and Description of Pain Patient Has Paino No Site Locations Pain Management and Medication Current Pain Management: Electronic Signature(s) Signed: 12/15/2014 9:48:49 AM By: Judene Companion MD Signed: 12/15/2014 2:50:48 PM By: Montey Hora Entered By: Judene Companion on 12/15/2014 09:48:49 Richard [name] ([id number]) -------------------------------------------------------------------------------- Patient/Caregiver Education Details Patient Name: Richard Hidden A. Date of Service: 12/15/2014 8:00 AM Medical Record Number: [id number] Patient Account Number: [id number] Date of Birth/Gender: Feb 14, 1951 (64 y.o. Male) Treating RN: Montey Hora Primary Care Physician: Bluford Kaufmann Other Clinician: Referring Physician: Bluford Kaufmann Treating Physician/Extender: Benjaman Pott in Treatment: 4 Education Assessment Education Provided To: Patient Education Topics Provided Wound/Skin Impairment: Handouts: Other: wound care as ordered Methods: Demonstration, Explain/Verbal Responses: State content correctly Electronic Signature(s) Signed: 12/15/2014 9:54:16 AM By: Judene Companion MD Entered By: Judene Companion on 12/15/2014 09:54:16 Richard [name] ([id number]) -------------------------------------------------------------------------------- Wound Assessment Details Patient Name: Richard Hidden A. Date of Service: 12/15/2014 8:00 AM Medical Record Number: [id number] Patient Account Number: [id number] Date of Birth/Sex: [birth year] (64 y.o. Male) Treating RN: Montey Hora Primary Care Physician: Bluford Kaufmann Other Clinician: Referring Physician: Bluford Kaufmann Treating Physician/Extender: Judene Companion Weeks in Treatment: 4 Wound Status Wound Number: 1 Primary Etiology: Trauma, Other Wound Location: Right, Proximal, Anterior Lower Wound Status: Healed - Epithelialized Leg Wounding Event: Surgical Injury Date Acquired: 10/24/2014 Weeks Of Treatment: 4 Clustered Wound: No Photos Photo Uploaded By: Montey Hora on 12/15/2014 12:40:46 Wound Measurements Length: (cm) 0 % Reducti Width: (cm) 0 % Reducti Depth: (cm) 0 Area: (cm) 0 Volume: (cm) 0 on in Area: 100% on in Volume: 100% Wound Description Full Thickness Without Exposed Classification: Support Structures Periwound Skin Texture Texture Color No Abnormalities Noted: No No Abnormalities Noted: No Moisture No Abnormalities Noted: No Electronic Signature(s) Richard [name], Richard [name] ([id number]) Signed: 12/15/2014 2:50:48 PM By: Montey Hora Entered By: Montey Hora on 12/15/2014 08:23:59 Richard [name]  ([id number]) -------------------------------------------------------------------------------- Wound Assessment Details Patient Name: Richard Hidden A. Date of Service: 12/15/2014 8:00 AM Medical Record Number: [id number] Patient Account Number: [id number] Date of Birth/Sex: [birth year] (64 y.o. Male) Treating RN: Montey Hora Primary Care Physician: Bluford Kaufmann Other Clinician: Referring Physician: Bluford Kaufmann Treating Physician/Extender: Judene Companion Weeks in Treatment: 4 Wound Status Wound Number: 3 Primary  Etiology: Trauma, Other Wound Location: Right Lower Leg - Lateral Wound Status: Open Wounding Event: Surgical Injury Comorbid History: Neuropathy Date Acquired: 10/24/2014 Weeks Of Treatment: 4 Clustered Wound: No Photos Photo Uploaded By: Montey Hora on 12/15/2014 12:40:46 Wound Measurements Length: (cm) 1.5 Width: (cm) 0.7 Depth: (cm) 0.2 Area: (cm) 0.825 Volume: (cm) 0.165 % Reduction in Area: 54.5% % Reduction in Volume: 54.5% Epithelialization: Small (1-33%) Tunneling: No Undermining: No Wound Description Full Thickness Without Exposed Classification: Support Structures Wound Margin: Distinct, outline attached Exudate Medium Amount: Exudate Type: Serosanguineous Exudate Color: red, brown Foul Odor After Cleansing: No Wound Bed Granulation Amount: Medium (34-66%) Exposed Structure Granulation Quality: Red, Pink Fascia Exposed: No Necrotic Amount: Medium (34-66%) Fat Layer Exposed: No [name], [name] ([id number]) Necrotic Quality: Adherent Slough Tendon Exposed: No Muscle Exposed: No Joint Exposed: No Bone Exposed: No Limited to Skin Breakdown Periwound Skin Texture Texture Color No Abnormalities Noted: No No Abnormalities Noted: No Callus: No Atrophie Blanche: No Crepitus: No Cyanosis: No Excoriation: No Ecchymosis: No Fluctuance: No Erythema: No Friable: No Hemosiderin Staining: No Induration: No Mottled:  No Localized Edema: Yes Pallor: No Rash: No Rubor: No Scarring: Yes Temperature / Pain Moisture Temperature: No Abnormality No Abnormalities Noted: No Tenderness on Palpation: Yes Dry / Scaly: No Maceration: No Moist: Yes Wound Preparation Ulcer Cleansing: Rinsed/Irrigated with Saline Topical Anesthetic Applied: Other: lidocaine 4%, Treatment Notes Wound #3 (Right, Lateral Lower Leg) 1. Cleansed with: Clean wound with Normal Saline 2. Anesthetic Topical Lidocaine 4% cream to wound bed prior to debridement 4. Dressing Applied: Aquacel Ag 5. Secondary Dressing Applied Bordered Foam Dressing 6. Footwear/Offloading device applied Tubigrip Electronic Signature(s) Signed: 12/15/2014 2:50:48 PM By: Montey Hora Entered By: Montey Hora on 12/15/2014 08:26:02 Richard [name] ([id number]) -------------------------------------------------------------------------------- Wound Assessment Details Patient Name: Richard Hidden A. Date of Service: 12/15/2014 8:00 AM Medical Record Number: [id number] Patient Account Number: [id number] Date of Birth/Sex: 06/06/1950 (64 y.o. Male) Treating RN: Montey Hora Primary Care Physician: Bluford Kaufmann Other Clinician: Referring Physician: Bluford Kaufmann Treating Physician/Extender: Judene Companion Weeks in Treatment: 4 Wound Status Wound Number: 4 Primary Etiology: Trauma, Other Wound Location: Right, Anterior Lower Leg Wound Status: Healed - Epithelialized Wounding Event: Surgical Injury Date Acquired: 10/24/2014 Weeks Of Treatment: 4 Clustered Wound: No Photos Photo Uploaded By: Montey Hora on 12/15/2014 12:40:15 Wound Measurements Length: (cm) 0 % Reducti Width: (cm) 0 % Reducti Depth: (cm) 0 Area: (cm) 0 Volume: (cm) 0 on in Area: 100% on in Volume: 100% Wound Description Full Thickness Without Exposed Classification: Support Structures Periwound Skin Texture Texture Color No Abnormalities Noted: No No  Abnormalities Noted: No Moisture No Abnormalities Noted: No Electronic Signature(s) Signed: 12/15/2014 2:50:48 PM By: Salli Quarry, Richard Gist ([id number]) Entered By: Montey Hora on 12/15/2014 08:25:02 Richard [name] ([id number]) -------------------------------------------------------------------------------- Wound Assessment Details Patient Name: Richard Hidden A. Date of Service: 12/15/2014 8:00 AM Medical Record Number: [id number] Patient Account Number: [id number] Date of Birth/Sex: 15-May-1950 (64 y.o. Male) Treating RN: Montey Hora Primary Care Physician: Bluford Kaufmann Other Clinician: Referring Physician: Bluford Kaufmann Treating Physician/Extender: Judene Companion Weeks in Treatment: 4 Wound Status Wound Number: 5 Primary Etiology: Trauma, Other Wound Location: Right, Distal, Anterior Lower Wound Status: Healed - Epithelialized Leg Wounding Event: Surgical Injury Date Acquired: 10/24/2014 Weeks Of Treatment: 4 Clustered Wound: No Photos Photo Uploaded By: Montey Hora on 12/15/2014 12:40:15 Wound Measurements Length: (cm) 0 % Reducti Width: (cm) 0 % Reducti Depth: (cm) 0 Area: (cm) 0 Volume: (cm) 0 on in Area: 100%  on in Volume: 100% Wound Description Full Thickness Without Exposed Classification: Support Structures Periwound Skin Texture Texture Color No Abnormalities Noted: No No Abnormalities Noted: No Moisture No Abnormalities Noted: No Electronic Signature(s) Richard [name], Richard [name] ([id number]) Signed: 12/15/2014 2:50:48 PM By: Montey Hora Entered By: Montey Hora on 12/15/2014 08:25:43 Richard [name] ([id number]) -------------------------------------------------------------------------------- [place] Details Patient Name: Richard Hidden A. Date of Service: 12/15/2014 8:00 AM Medical Record Number: [id number] Patient Account Number: [id number] Date of Birth/Sex: [birth year] (64 y.o. Male) Treating RN: Montey Hora Primary  Care Physician: Bluford Kaufmann Other Clinician: Referring Physician: Bluford Kaufmann Treating Physician/Extender: Benjaman Pott in Treatment: 4 Vital Signs Time Taken: 08:14 Temperature (F): 98.5 Height (in): 68 Pulse (bpm): 70 Weight (lbs): 165 Respiratory Rate (breaths/min): 18 Body Mass Index (BMI): 25.1 Blood Pressure (mmHg): 119/67 Reference Range: 80 - 120 mg / dl Electronic Signature(s) Signed: 12/15/2014 9:48:58 AM By: Judene Companion MD Entered By: Judene Companion on 12/15/2014 09:48:58

## 2014-12-23 ENCOUNTER — Encounter: Payer: BLUE CROSS/BLUE SHIELD | Attending: Surgery | Admitting: Surgery

## 2014-12-23 DIAGNOSIS — T8131XA Disruption of external operation (surgical) wound, not elsewhere classified, initial encounter: Secondary | ICD-10-CM | POA: Diagnosis not present

## 2014-12-23 DIAGNOSIS — X58XXXA Exposure to other specified factors, initial encounter: Secondary | ICD-10-CM | POA: Diagnosis not present

## 2014-12-23 DIAGNOSIS — R600 Localized edema: Secondary | ICD-10-CM | POA: Diagnosis not present

## 2014-12-23 DIAGNOSIS — S81811A Laceration without foreign body, right lower leg, initial encounter: Secondary | ICD-10-CM | POA: Diagnosis present

## 2014-12-23 NOTE — Progress Notes (Signed)
CLINTON, DRAGONE ([id number]) Visit Report for 12/23/2014 Arrival Information Details Patient Name: Richard [name], Richard [name]. Date of Service: 12/23/2014 8:00 AM Medical Record Number: [id number] Patient Account Number: [id number] Date of Birth/Sex: 01/22/1951 (64 y.o. Male) Treating RN: Montey Hora Primary Care Physician: Bluford Kaufmann Other Clinician: Referring Physician: Bluford Kaufmann Treating Physician/Extender: Frann Rider in Treatment: 5 Visit Information History Since Last Visit Added or deleted any medications: No Patient Arrived: Wheel Chair Any new allergies or adverse reactions: No Arrival Time: 08:23 Had a fall or experienced change in No Accompanied By: self activities of daily living that may affect Transfer Assistance: None risk of falls: Patient Identification Verified: Yes Signs or symptoms of abuse/neglect since last No Secondary Verification Process Yes visito Completed: Hospitalized since last visit: No Patient Requires Transmission- No Pain Present Now: No Based Precautions: Patient Has Alerts: Yes Patient Alerts: Patient on Blood Thinner Xarelto Electronic Signature(s) Signed: 12/23/2014 4:44:35 PM By: Montey Hora Entered By: Montey Hora on 12/23/2014 08:23:21 Richard [name] ([id number]) -------------------------------------------------------------------------------- Clinic Level of Care Assessment Details Patient Name: Richard Hidden A. Date of Service: 12/23/2014 8:00 AM Medical Record Number: [id number] Patient Account Number: [id number] Date of Birth/Sex: [birth year] (64 y.o. Male) Treating RN: Montey Hora Primary Care Physician: Bluford Kaufmann Other Clinician: Referring Physician: Bluford Kaufmann Treating Physician/Extender: Frann Rider in Treatment: 5 Clinic Level of Care Assessment Items TOOL 4 Quantity Score []  - Use when only an EandM is performed on FOLLOW-UP visit 0 ASSESSMENTS - Nursing Assessment /  Reassessment X - Reassessment of Co-morbidities (includes updates in patient status) 1 10 X - Reassessment of Adherence to Treatment Plan 1 5 ASSESSMENTS - Wound and Skin Assessment / Reassessment []  - Simple Wound Assessment / Reassessment - one wound 0 X - Complex Wound Assessment / Reassessment - multiple wounds 2 5 []  - Dermatologic / Skin Assessment (not related to wound area) 0 ASSESSMENTS - Focused Assessment X - Circumferential Edema Measurements - multi extremities 1 5 []  - Nutritional Assessment / Counseling / Intervention 0 X - Lower Extremity Assessment (monofilament, tuning fork, pulses) 1 5 []  - Peripheral Arterial Disease Assessment (using hand held doppler) 0 ASSESSMENTS - Ostomy and/or Continence Assessment and Care []  - Incontinence Assessment and Management 0 []  - Ostomy Care Assessment and Management (repouching, etc.) 0 PROCESS - Coordination of Care X - Simple Patient / Family Education for ongoing care 1 15 []  - Complex (extensive) Patient / Family Education for ongoing care 0 []  - Staff obtains Programmer, systems, Records, Test Results / Process Orders 0 []  - Staff telephones HHA, Nursing Homes / Clarify orders / etc 0 []  - Routine Transfer to another Facility (non-emergent condition) 0 Richard [name], [name] ([id number]) []  - Routine Hospital Admission (non-emergent condition) 0 []  - New Admissions / Biomedical engineer / Ordering NPWT, Apligraf, etc. 0 []  - Emergency Hospital Admission (emergent condition) 0 X - Simple Discharge Coordination 1 10 []  - Complex (extensive) Discharge Coordination 0 PROCESS - Special Needs []  - Pediatric / Minor Patient Management 0 []  - Isolation Patient Management 0 []  - Hearing / Language / Visual special needs 0 []  - Assessment of Community assistance (transportation, D/C planning, etc.) 0 []  - Additional assistance / Altered mentation 0 []  - Support Surface(s) Assessment (bed, cushion, seat, etc.) 0 INTERVENTIONS - Wound Cleansing /  Measurement []  - Simple Wound Cleansing - one wound 0 X - Complex Wound Cleansing - multiple wounds 2 5 X - Wound Imaging (photographs - any number of wounds) 1  5 []  - Wound Tracing (instead of photographs) 0 []  - Simple Wound Measurement - one wound 0 X - Complex Wound Measurement - multiple wounds 2 5 INTERVENTIONS - Wound Dressings X - Small Wound Dressing one or multiple wounds 2 10 []  - Medium Wound Dressing one or multiple wounds 0 []  - Large Wound Dressing one or multiple wounds 0 []  - Application of Medications - topical 0 []  - Application of Medications - injection 0 INTERVENTIONS - Miscellaneous []  - External ear exam 0 Richard [name], [name] ([id number]) []  - Specimen Collection (cultures, biopsies, blood, body fluids, etc.) 0 []  - Specimen(s) / Culture(s) sent or taken to Lab for analysis 0 []  - Patient Transfer (multiple staff / Harrel Lemon Lift / Similar devices) 0 []  - Simple Staple / Suture removal (25 or less) 0 []  - Complex Staple / Suture removal (26 or more) 0 []  - Hypo / Hyperglycemic Management (close monitor of Blood Glucose) 0 []  - Ankle / Brachial Index (ABI) - do not check if billed separately 0 X - Vital Signs 1 5 Has the patient been seen at the hospital within the last three years: Yes Total Score: 110 Level Of Care: New/Established - Level 3 Electronic Signature(s) Signed: 12/23/2014 4:44:35 PM By: Montey Hora Entered By: Montey Hora on 12/23/2014 08:40:28 Richard [name] ([id number]) -------------------------------------------------------------------------------- Encounter Discharge Information Details Patient Name: Richard Hidden A. Date of Service: 12/23/2014 8:00 AM Medical Record Number: [id number] Patient Account Number: [id number] Date of Birth/Sex: 1950-04-25 (64 y.o. Male) Treating RN: Montey Hora Primary Care Physician: Bluford Kaufmann Other Clinician: Referring Physician: Bluford Kaufmann Treating Physician/Extender: Frann Rider  in Treatment: 5 Encounter Discharge Information Items Discharge Pain Level: 0 Discharge Condition: Stable Ambulatory Status: Wheelchair Discharge Destination: Nursing Home Transportation: Ambulance Accompanied By: EMS Schedule Follow-up Appointment: Yes Medication Reconciliation completed No and provided to Patient/Care Shanty Ginty: Provided on Clinical Summary of Care: 12/23/2014 Form Type Recipient Paper Patient [facility] Electronic Signature(s) Signed: 12/23/2014 8:55:56 AM By: Ruthine Dose Entered By: Ruthine Dose on 12/23/2014 08:55:56 Richard [name] ([id number]) -------------------------------------------------------------------------------- Lower Extremity Assessment Details Patient Name: Richard Hidden A. Date of Service: 12/23/2014 8:00 AM Medical Record Number: [id number] Patient Account Number: [id number] Date of Birth/Sex: [birth year] (64 y.o. Male) Treating RN: Montey Hora Primary Care Physician: Bluford Kaufmann Other Clinician: Referring Physician: Bluford Kaufmann Treating Physician/Extender: Frann Rider in Treatment: 5 Edema Assessment Assessed: Shirlyn Goltz: No] [Right: No] Edema: [Left: Ye] [Right: s] Calf Left: Right: Point of Measurement: 32 cm From Medial Instep cm 38.6 cm Ankle Left: Right: Point of Measurement: 8 cm From Medial Instep cm 29 cm Vascular Assessment Pulses: Posterior Tibial Dorsalis Pedis Palpable: [Right:Yes] Extremity colors, hair growth, and conditions: Extremity Color: [Right:Mottled] Hair Growth on Extremity: [Right:Yes] Temperature of Extremity: [Right:Warm] Capillary Refill: [Right:< 3 seconds] Electronic Signature(s) Signed: 12/23/2014 4:44:35 PM By: Montey Hora Entered By: Montey Hora on 12/23/2014 08:30:18 Richard [name] ([id number]) -------------------------------------------------------------------------------- Multi Wound Chart Details Patient Name: Richard Hidden A. Date of Service: 12/23/2014 8:00  AM Medical Record Number: [id number] Patient Account Number: [id number] Date of Birth/Sex: 1950-08-31 (64 y.o. Male) Treating RN: Montey Hora Primary Care Physician: Bluford Kaufmann Other Clinician: Referring Physician: Bluford Kaufmann Treating Physician/Extender: Frann Rider in Treatment: 5 Vital Signs Height(in): 68 Pulse(bpm): 68 Weight(lbs): 165 Blood Pressure 123/67 (mmHg): Body Mass Index(BMI): 25 Temperature(F): 98.3 Respiratory Rate 18 (breaths/min): Photos: [3:No Photos] [6:No Photos] [N/A:N/A] Wound Location: [3:Right Lower Leg - Lateral Right Lower Leg -] [6:Proximal] [N/A:N/A] Wounding Event: [3:Surgical Injury] [6:Gradually Appeared] [  N/A:N/A] Primary Etiology: [3:Trauma, Other] [6:To be determined] [N/A:N/A] Comorbid History: [3:Neuropathy] [6:Neuropathy] [N/A:N/A] Date Acquired: [3:10/24/2014] [6:12/23/2014] [N/A:N/A] Weeks of Treatment: [3:5] [6:0] [N/A:N/A] Wound Status: [3:Open] [6:Open] [N/A:N/A] Measurements L x W x D 0.8x0.3x0.1 [6:3.8x0.9x0.2] [N/A:N/A] (cm) Area (cm) : [3:0.188] [6:2.686] [N/A:N/A] Volume (cm) : [3:0.019] [6:0.537] [N/A:N/A] % Reduction in Area: [3:89.60%] [6:0.00%] [N/A:N/A] % Reduction in Volume: 94.80% [6:0.00%] [N/A:N/A] Classification: [3:Full Thickness Without Exposed Support Structures] [6:Partial Thickness] [N/A:N/A] Exudate Amount: [3:Medium] [6:Medium] [N/A:N/A] Exudate Type: [3:Serosanguineous] [6:Sanguinous] [N/A:N/A] Exudate Color: [3:red, brown] [6:red] [N/A:N/A] Wound Margin: [3:Distinct, outline attached Flat and Intact] [N/A:N/A] Granulation Amount: [3:Large (67-100%)] [6:Large (67-100%)] [N/A:N/A] Granulation Quality: [3:Red, Pink] [6:Red] [N/A:N/A] Necrotic Amount: [3:None Present (0%)] [6:None Present (0%)] [N/A:N/A] Exposed Structures: [3:Fascia: No Fat: No Tendon: No Muscle: No Joint: No] [6:Fascia: No Fat: No Tendon: No Muscle: No Joint: No] [N/A:N/A] Bone: No Bone: No Limited to Skin  Limited to Skin Breakdown Breakdown Epithelialization: Small (1-33%) None N/A Periwound Skin Texture: Edema: Yes Edema: No N/A Scarring: Yes Excoriation: No Excoriation: No Induration: No Induration: No Callus: No Callus: No Crepitus: No Crepitus: No Fluctuance: No Fluctuance: No Friable: No Friable: No Rash: No Rash: No Scarring: No Periwound Skin Moist: Yes Maceration: No N/A Moisture: Maceration: No Moist: No Dry/Scaly: No Dry/Scaly: No Periwound Skin Color: Atrophie Blanche: No Atrophie Blanche: No N/A Cyanosis: No Cyanosis: No Ecchymosis: No Ecchymosis: No Erythema: No Erythema: No Hemosiderin Staining: No Hemosiderin Staining: No Mottled: No Mottled: No Pallor: No Pallor: No Rubor: No Rubor: No Temperature: No Abnormality No Abnormality N/A Tenderness on Yes No N/A Palpation: Wound Preparation: Ulcer Cleansing: Ulcer Cleansing: N/A Rinsed/Irrigated with Rinsed/Irrigated with Saline Saline Topical Anesthetic Topical Anesthetic Applied: None, Other: Applied: None lidocaine 4% Treatment Notes Electronic Signature(s) Signed: 12/23/2014 4:44:35 PM By: Montey Hora Entered By: Montey Hora on 12/23/2014 08:37:39 Richard [name] ([id number]) -------------------------------------------------------------------------------- [place] Details Patient Name: Richard Hidden A. Date of Service: 12/23/2014 8:00 AM Medical Record Number: [id number] Patient Account Number: [id number] Date of Birth/Sex: [birth year] (64 y.o. Male) Treating RN: Montey Hora Primary Care Physician: Bluford Kaufmann Other Clinician: Referring Physician: Bluford Kaufmann Treating Physician/Extender: Frann Rider in Treatment: 5 Active Inactive Orientation to the Wound Care Program Nursing Diagnoses: Knowledge deficit related to the wound healing center program Goals: Patient/caregiver will verbalize understanding of the [place]  Program Date Initiated: 11/17/2014 Goal Status: Active Interventions: Provide education on orientation to the wound center Notes: Wound/Skin Impairment Nursing Diagnoses: Impaired tissue integrity Knowledge deficit related to ulceration/compromised skin integrity Goals: Patient/caregiver will verbalize understanding of skin care regimen Date Initiated: 11/17/2014 Goal Status: Active Ulcer/skin breakdown will have a volume reduction of 30% by week 4 Date Initiated: 11/17/2014 Goal Status: Active Ulcer/skin breakdown will have a volume reduction of 50% by week 8 Date Initiated: 11/17/2014 Goal Status: Active Ulcer/skin breakdown will have a volume reduction of 80% by week 12 Date Initiated: 11/17/2014 Goal Status: Active Ulcer/skin breakdown will heal within 14 weeks Date Initiated: 11/17/2014 Richard [name], Richard [name] ([id number]) Goal Status: Active Interventions: Assess patient/caregiver ability to obtain necessary supplies Assess patient/caregiver ability to perform ulcer/skin care regimen upon admission and as needed Assess ulceration(s) every visit Provide education on ulcer and skin care Treatment Activities: Skin care regimen initiated : 12/23/2014 Topical wound management initiated : 12/23/2014 Notes: Electronic Signature(s) Signed: 12/23/2014 4:44:35 PM By: Montey Hora Entered By: Montey Hora on 12/23/2014 08:35:09 Richard [name] ([id number]) -------------------------------------------------------------------------------- Patient/Caregiver Education Details Patient Name: Richard Hidden A. Date of Service: 12/23/2014 8:00  AM Medical Record Number: [id number] Patient Account Number: [id number] Date of Birth/Gender: 1951/03/07 (64 y.o. Male) Treating RN: Montey Hora Primary Care Physician: Bluford Kaufmann Other Clinician: Referring Physician: Bluford Kaufmann Treating Physician/Extender: Frann Rider in Treatment: 5 Education Assessment Education Provided  To: Patient Education Topics Provided Basic Hygiene: Handouts: Other: keep hands off of wounds, no scratching Methods: Demonstration, Explain/Verbal Responses: State content correctly Electronic Signature(s) Signed: 12/23/2014 4:44:35 PM By: Montey Hora Entered By: Montey Hora on 12/23/2014 08:38:44 Richard [name] ([id number]) -------------------------------------------------------------------------------- Wound Assessment Details Patient Name: Richard Hidden A. Date of Service: 12/23/2014 8:00 AM Medical Record Number: [id number] Patient Account Number: [id number] Date of Birth/Sex: [birth year] (64 y.o. Male) Treating RN: Montey Hora Primary Care Physician: Bluford Kaufmann Other Clinician: Referring Physician: Bluford Kaufmann Treating Physician/Extender: Frann Rider in Treatment: 5 Wound Status Wound Number: 3 Primary Etiology: Trauma, Other Wound Location: Right Lower Leg - Lateral Wound Status: Open Wounding Event: Surgical Injury Comorbid History: Neuropathy Date Acquired: 10/24/2014 Weeks Of Treatment: 5 Clustered Wound: No Photos Photo Uploaded By: Montey Hora on 12/23/2014 11:47:59 Wound Measurements Length: (cm) 0.8 Width: (cm) 0.3 Depth: (cm) 0.1 Area: (cm) 0.188 Volume: (cm) 0.019 % Reduction in Area: 89.6% % Reduction in Volume: 94.8% Epithelialization: Small (1-33%) Tunneling: No Undermining: No Wound Description Full Thickness Without Exposed Classification: Support Structures Wound Margin: Distinct, outline attached Exudate Medium Amount: Exudate Type: Serosanguineous Exudate Color: red, brown Foul Odor After Cleansing: No Wound Bed Granulation Amount: Large (67-100%) Exposed Structure Granulation Quality: Red, Pink Fascia Exposed: No Necrotic Amount: None Present (0%) Fat Layer Exposed: No Silverman, [name] ([id number]) Tendon Exposed: No Muscle Exposed: No Joint Exposed: No Bone Exposed: No Limited to Skin  Breakdown Periwound Skin Texture Texture Color No Abnormalities Noted: No No Abnormalities Noted: No Callus: No Atrophie Blanche: No Crepitus: No Cyanosis: No Excoriation: No Ecchymosis: No Fluctuance: No Erythema: No Friable: No Hemosiderin Staining: No Induration: No Mottled: No Localized Edema: Yes Pallor: No Rash: No Rubor: No Scarring: Yes Temperature / Pain Moisture Temperature: No Abnormality No Abnormalities Noted: No Tenderness on Palpation: Yes Dry / Scaly: No Maceration: No Moist: Yes Wound Preparation Ulcer Cleansing: Rinsed/Irrigated with Saline Topical Anesthetic Applied: None, Other: lidocaine 4%, Treatment Notes Wound #3 (Right, Lateral Lower Leg) 1. Cleansed with: Clean wound with Normal Saline 3. Peri-wound Care: Skin Prep 4. Dressing Applied: Prisma Ag 5. Secondary Dressing Applied Bordered Foam Dressing 6. Footwear/Offloading device applied Ace wrap Electronic Signature(s) Signed: 12/23/2014 4:44:35 PM By: Montey Hora Entered By: Montey Hora on 12/23/2014 08:32:42 Richard [name] ([id number]) -------------------------------------------------------------------------------- Wound Assessment Details Patient Name: Richard Hidden A. Date of Service: 12/23/2014 8:00 AM Medical Record Number: [id number] Patient Account Number: [id number] Date of Birth/Sex: 1950-04-28 (64 y.o. Male) Treating RN: Montey Hora Primary Care Physician: Bluford Kaufmann Other Clinician: Referring Physician: Bluford Kaufmann Treating Physician/Extender: Frann Rider in Treatment: 5 Wound Status Wound Number: 6 Primary Etiology: To be determined Wound Location: Right Lower Leg - Proximal Wound Status: Open Wounding Event: Gradually Appeared Comorbid History: Neuropathy Date Acquired: 12/23/2014 Weeks Of Treatment: 0 Clustered Wound: No Photos Photo Uploaded By: Montey Hora on 12/23/2014 11:48:00 Wound Measurements Length: (cm) 3.8 Width:  (cm) 0.9 Depth: (cm) 0.2 Area: (cm) 2.686 Volume: (cm) 0.537 % Reduction in Area: 0% % Reduction in Volume: 0% Epithelialization: None Tunneling: No Undermining: No Wound Description Classification: Partial Thickness Wound Margin: Flat and Intact Exudate Amount: Medium Exudate Type: Sanguinous Exudate Color: red Foul Odor After Cleansing: No Wound Bed Granulation Amount:  Large (67-100%) Exposed Structure Granulation Quality: Red Fascia Exposed: No Necrotic Amount: None Present (0%) Fat Layer Exposed: No Tendon Exposed: No Pelot, Karis A. ([id number]) Muscle Exposed: No Joint Exposed: No Bone Exposed: No Limited to Skin Breakdown Periwound Skin Texture Texture Color No Abnormalities Noted: No No Abnormalities Noted: No Callus: No Atrophie Blanche: No Crepitus: No Cyanosis: No Excoriation: No Ecchymosis: No Fluctuance: No Erythema: No Friable: No Hemosiderin Staining: No Induration: No Mottled: No Localized Edema: No Pallor: No Rash: No Rubor: No Scarring: No Temperature / Pain Moisture Temperature: No Abnormality No Abnormalities Noted: No Dry / Scaly: No Maceration: No Moist: No Wound Preparation Ulcer Cleansing: Rinsed/Irrigated with Saline Topical Anesthetic Applied: None Treatment Notes Wound #6 (Right, Proximal Lower Leg) 1. Cleansed with: Clean wound with Normal Saline 3. Peri-wound Care: Skin Prep 4. Dressing Applied: Prisma Ag 5. Secondary Dressing Applied Bordered Foam Dressing 6. Footwear/Offloading device applied Ace wrap Electronic Signature(s) Signed: 12/23/2014 4:44:35 PM By: Montey Hora Entered By: Montey Hora on 12/23/2014 08:35:00 Richard [name] ([id number]) -------------------------------------------------------------------------------- Vitals Details Patient Name: Richard Hidden A. Date of Service: 12/23/2014 8:00 AM Medical Record Number: [id number] Patient Account Number: [id number] Date of Birth/Sex: [birth year]  (64 y.o. Male) Treating RN: Montey Hora Primary Care Physician: Bluford Kaufmann Other Clinician: Referring Physician: Bluford Kaufmann Treating Physician/Extender: Frann Rider in Treatment: 5 Vital Signs Time Taken: 08:24 Temperature (F): 98.3 Height (in): 68 Pulse (bpm): 68 Weight (lbs): 165 Respiratory Rate (breaths/min): 18 Body Mass Index (BMI): 25.1 Blood Pressure (mmHg): 123/67 Reference Range: 80 - 120 mg / dl Electronic Signature(s) Signed: 12/23/2014 4:44:35 PM By: Montey Hora Entered By: Montey Hora on 12/23/2014 08:25:39
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
## 2014-12-30 ENCOUNTER — Encounter: Payer: BLUE CROSS/BLUE SHIELD | Admitting: Surgery

## 2014-12-30 DIAGNOSIS — S81811A Laceration without foreign body, right lower leg, initial encounter: Secondary | ICD-10-CM | POA: Diagnosis not present
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
## 2014-12-31 NOTE — Progress Notes (Signed)
ISACK, LAVALLEY ([id number]) Visit Report for 12/30/2014 Arrival Information Details Patient Name: Richard [name], Richard [name]. Date of Service: 12/30/2014 8:00 AM Medical Record Number: [id number] Patient Account Number: [id number] Date of Birth/Sex: 1950-08-26 (64 y.o. Male) Treating RN: Montey Hora Primary Care Physician: Bluford Kaufmann Other Clinician: Referring Physician: Bluford Kaufmann Treating Physician/Extender: Frann Rider in Treatment: 6 Visit Information History Since Last Visit Added or deleted any medications: No Patient Arrived: Wheel Chair Any new allergies or adverse reactions: No Arrival Time: 08:15 Had a fall or experienced change in No Accompanied By: self activities of daily living that may affect Transfer Assistance: None risk of falls: Patient Identification Verified: Yes Signs or symptoms of abuse/neglect since last No Secondary Verification Process Yes visito Completed: Hospitalized since last visit: No Patient Requires Transmission- No Pain Present Now: No Based Precautions: Patient Has Alerts: Yes Patient Alerts: Patient on Blood Thinner Xarelto Electronic Signature(s) Signed: 12/30/2014 4:50:36 PM By: Montey Hora Entered By: Montey Hora on 12/30/2014 08:15:46 Richard [name] ([id number]) -------------------------------------------------------------------------------- Clinic Level of Care Assessment Details Patient Name: Richard Hidden A. Date of Service: 12/30/2014 8:00 AM Medical Record Number: [id number] Patient Account Number: [id number] Date of Birth/Sex: 1950-12-27 (64 y.o. Male) Treating RN: Montey Hora Primary Care Physician: Bluford Kaufmann Other Clinician: Referring Physician: Bluford Kaufmann Treating Physician/Extender: Frann Rider in Treatment: 6 Clinic Level of Care Assessment Items TOOL 4 Quantity Score []  - Use when only an EandM is performed on FOLLOW-UP visit 0 ASSESSMENTS - Nursing Assessment  / Reassessment X - Reassessment of Co-morbidities (includes updates in patient status) 1 10 X - Reassessment of Adherence to Treatment Plan 1 5 ASSESSMENTS - Wound and Skin Assessment / Reassessment X - Simple Wound Assessment / Reassessment - one wound 1 5 []  - Complex Wound Assessment / Reassessment - multiple wounds 0 []  - Dermatologic / Skin Assessment (not related to wound area) 0 ASSESSMENTS - Focused Assessment X - Circumferential Edema Measurements - multi extremities 1 5 []  - Nutritional Assessment / Counseling / Intervention 0 X - Lower Extremity Assessment (monofilament, tuning fork, pulses) 1 5 []  - Peripheral Arterial Disease Assessment (using hand held doppler) 0 ASSESSMENTS - Ostomy and/or Continence Assessment and Care []  - Incontinence Assessment and Management 0 []  - Ostomy Care Assessment and Management (repouching, etc.) 0 PROCESS - Coordination of Care X - Simple Patient / Family Education for ongoing care 1 15 []  - Complex (extensive) Patient / Family Education for ongoing care 0 []  - Staff obtains Programmer, systems, Records, Test Results / Process Orders 0 []  - Staff telephones HHA, Nursing Homes / Clarify orders / etc 0 []  - Routine Transfer to another Facility (non-emergent condition) 0 Richard [name], [name] ([id number]) []  - Routine Hospital Admission (non-emergent condition) 0 []  - New Admissions / Biomedical engineer / Ordering NPWT, Apligraf, etc. 0 []  - Emergency Hospital Admission (emergent condition) 0 X - Simple Discharge Coordination 1 10 []  - Complex (extensive) Discharge Coordination 0 PROCESS - Special Needs []  - Pediatric / Minor Patient Management 0 []  - Isolation Patient Management 0 []  - Hearing / Language / Visual special needs 0 []  - Assessment of Community assistance (transportation, D/C planning, etc.) 0 []  - Additional assistance / Altered mentation 0 []  - Support Surface(s) Assessment (bed, cushion, seat, etc.) 0 INTERVENTIONS - Wound Cleansing  / Measurement X - Simple Wound Cleansing - one wound 1 5 []  - Complex Wound Cleansing - multiple wounds 0 X - Wound Imaging (photographs - any number of wounds) 1  5 []  - Wound Tracing (instead of photographs) 0 X - Simple Wound Measurement - one wound 1 5 []  - Complex Wound Measurement - multiple wounds 0 INTERVENTIONS - Wound Dressings X - Small Wound Dressing one or multiple wounds 1 10 []  - Medium Wound Dressing one or multiple wounds 0 []  - Large Wound Dressing one or multiple wounds 0 []  - Application of Medications - topical 0 []  - Application of Medications - injection 0 INTERVENTIONS - Miscellaneous []  - External ear exam 0 Richard [name], [name] ([id number]) []  - Specimen Collection (cultures, biopsies, blood, body fluids, etc.) 0 []  - Specimen(s) / Culture(s) sent or taken to Lab for analysis 0 []  - Patient Transfer (multiple staff / Harrel Lemon Lift / Similar devices) 0 []  - Simple Staple / Suture removal (25 or less) 0 []  - Complex Staple / Suture removal (26 or more) 0 []  - Hypo / Hyperglycemic Management (close monitor of Blood Glucose) 0 []  - Ankle / Brachial Index (ABI) - do not check if billed separately 0 X - Vital Signs 1 5 Has the patient been seen at the hospital within the last three years: Yes Total Score: 85 Level Of Care: New/Established - Level 3 Electronic Signature(s) Signed: 12/30/2014 4:50:36 PM By: Montey Hora Entered By: Montey Hora on 12/30/2014 08:40:40 Richard [name] ([id number]) -------------------------------------------------------------------------------- Encounter Discharge Information Details Patient Name: Richard Hidden A. Date of Service: 12/30/2014 8:00 AM Medical Record Number: [id number] Patient Account Number: [id number] Date of Birth/Sex: November 02, 1950 (64 y.o. Male) Treating RN: Montey Hora Primary Care Physician: Bluford Kaufmann Other Clinician: Referring Physician: Bluford Kaufmann Treating Physician/Extender: Frann Rider in Treatment: 6 Encounter Discharge Information Items Discharge Pain Level: 0 Discharge Condition: Stable Ambulatory Status: Wheelchair Discharge Destination: Nursing Home Transportation: Private Auto Accompanied By: self Schedule Follow-up Appointment: Yes Medication Reconciliation completed and provided to Patient/Care No Richard [name]: Provided on Clinical Summary of Care: 12/30/2014 Form Type Recipient Paper Patient [facility] Electronic Signature(s) Signed: 12/30/2014 8:54:31 AM By: Ruthine Dose Entered By: Ruthine Dose on 12/30/2014 08:54:31 Richard [name] ([id number]) -------------------------------------------------------------------------------- Lower Extremity Assessment Details Patient Name: Richard Hidden A. Date of Service: 12/30/2014 8:00 AM Medical Record Number: [id number] Patient Account Number: [id number] Date of Birth/Sex: [birth year] (64 y.o. Male) Treating RN: Montey Hora Primary Care Physician: Bluford Kaufmann Other Clinician: Referring Physician: Bluford Kaufmann Treating Physician/Extender: Frann Rider in Treatment: 6 Edema Assessment Assessed: Shirlyn Goltz: No] [Right: No] Edema: [Left: Ye] [Right: s] Calf Left: Right: Point of Measurement: 32 cm From Medial Instep cm 37 cm Ankle Left: Right: Point of Measurement: 8 cm From Medial Instep cm 28 cm Vascular Assessment Pulses: Posterior Tibial Dorsalis Pedis Palpable: [Right:Yes] Extremity colors, hair growth, and conditions: Extremity Color: [Right:Mottled] Hair Growth on Extremity: [Right:Yes] Temperature of Extremity: [Right:Warm] Capillary Refill: [Right:< 3 seconds] Toe Nail Assessment Left: Right: Thick: No Discolored: No Deformed: No Improper Length and Hygiene: No Electronic Signature(s) Signed: 12/30/2014 4:50:36 PM By: Montey Hora Entered By: Montey Hora on 12/30/2014 08:27:13 Richard [name]  ([id number]) -------------------------------------------------------------------------------- Multi Wound Chart Details Patient Name: Richard Hidden A. Date of Service: 12/30/2014 8:00 AM Medical Record Number: [id number] Patient Account Number: [id number] Date of Birth/Sex: [birth year] (64 y.o. Male) Treating RN: Montey Hora Primary Care Physician: Bluford Kaufmann Other Clinician: Referring Physician: Bluford Kaufmann Treating Physician/Extender: Frann Rider in Treatment: 6 Vital Signs Height(in): 68 Pulse(bpm): 62 Weight(lbs): 165 Blood Pressure 126/74 (mmHg): Body Mass Index(BMI): 25 Temperature(F): 98.4 Respiratory Rate 18 (breaths/min): Photos: [3:No Photos] [6:No Photos] [N/A:N/A] Wound Location: [  3:Right, Lateral Lower Leg Right Lower Leg -] [6:Proximal] [N/A:N/A] Wounding Event: [3:Surgical Injury] [6:Gradually Appeared] [N/A:N/A] Primary Etiology: [3:Trauma, Other] [6:To be determined] [N/A:N/A] Comorbid History: [3:N/A] [6:Neuropathy] [N/A:N/A] Date Acquired: [3:10/24/2014] [6:12/23/2014] [N/A:N/A] Weeks of Treatment: [3:6] [6:1] [N/A:N/A] Wound Status: [3:Open] [6:Open] [N/A:N/A] Measurements L x W x D 0x0x0 [6:0.4x0.2x0.1] [N/A:N/A] (cm) Area (cm) : [3:0] [7:6.283] [N/A:N/A] Volume (cm) : [3:0] [1:5.176] [N/A:N/A] % Reduction in Area: [3:100.00%] [6:97.70%] [N/A:N/A] % Reduction in Volume: 100.00% [6:98.90%] [N/A:N/A] Classification: [3:Full Thickness Without Exposed Support Structures] [6:Partial Thickness] [N/A:N/A] Exudate Amount: [3:N/A] [6:Medium] [N/A:N/A] Exudate Type: [3:N/A] [6:Sanguinous] [N/A:N/A] Exudate Color: [3:N/A] [6:red] [N/A:N/A] Wound Margin: [3:N/A] [6:Flat and Intact] [N/A:N/A] Granulation Amount: [3:N/A] [6:Large (67-100%)] [N/A:N/A] Granulation Quality: [3:N/A] [6:Red] [N/A:N/A] Necrotic Amount: [3:N/A] [6:None Present (0%)] [N/A:N/A] Epithelialization: [3:N/A] [6:None] [N/A:N/A] Periwound Skin Texture: No Abnormalities  Noted Edema: No [6:Excoriation: No Induration: No Callus: No] [N/A:N/A] Crepitus: No Fluctuance: No Friable: No Rash: No Scarring: No Periwound Skin No Abnormalities Noted Maceration: No N/A Moisture: Moist: No Dry/Scaly: No Periwound Skin Color: No Abnormalities Noted Atrophie Blanche: No N/A Cyanosis: No Ecchymosis: No Erythema: No Hemosiderin Staining: No Mottled: No Pallor: No Rubor: No Temperature: N/A No Abnormality N/A Tenderness on No No N/A Palpation: Wound Preparation: N/A Ulcer Cleansing: N/A Rinsed/Irrigated with Saline Topical Anesthetic Applied: None Treatment Notes Electronic Signature(s) Signed: 12/30/2014 4:50:36 PM By: Montey Hora Entered By: Montey Hora on 12/30/2014 08:27:43 Richard [name] ([id number]) -------------------------------------------------------------------------------- [place] Details Patient Name: Richard Hidden A. Date of Service: 12/30/2014 8:00 AM Medical Record Number: [id number] Patient Account Number: [id number] Date of Birth/Sex: 14-Apr-1951 (64 y.o. Male) Treating RN: Montey Hora Primary Care Physician: Bluford Kaufmann Other Clinician: Referring Physician: Bluford Kaufmann Treating Physician/Extender: Frann Rider in Treatment: 6 Active Inactive Orientation to the Wound Care Program Nursing Diagnoses: Knowledge deficit related to the wound healing center program Goals: Patient/caregiver will verbalize understanding of the [facility] Date Initiated: 11/17/2014 Goal Status: Active Interventions: Provide education on orientation to the wound center Notes: Wound/Skin Impairment Nursing Diagnoses: Impaired tissue integrity Knowledge deficit related to ulceration/compromised skin integrity Goals: Patient/caregiver will verbalize understanding of skin care regimen Date Initiated: 11/17/2014 Goal Status: Active Ulcer/skin breakdown will have a volume reduction of 30%  by week 4 Date Initiated: 11/17/2014 Goal Status: Active Ulcer/skin breakdown will have a volume reduction of 50% by week 8 Date Initiated: 11/17/2014 Goal Status: Active Ulcer/skin breakdown will have a volume reduction of 80% by week 12 Date Initiated: 11/17/2014 Goal Status: Active Ulcer/skin breakdown will heal within 14 weeks Date Initiated: 11/17/2014 CURRAN, LENDERMAN ([id number]) Goal Status: Active Interventions: Assess patient/caregiver ability to obtain necessary supplies Assess patient/caregiver ability to perform ulcer/skin care regimen upon admission and as needed Assess ulceration(s) every visit Provide education on ulcer and skin care Treatment Activities: Skin care regimen initiated : 12/30/2014 Topical wound management initiated : 12/30/2014 Notes: Electronic Signature(s) Signed: 12/30/2014 4:50:36 PM By: Montey Hora Entered By: Montey Hora on 12/30/2014 08:27:31 Richard [name] ([id number]) -------------------------------------------------------------------------------- Patient/Caregiver Education Details Patient Name: Richard Hidden A. Date of Service: 12/30/2014 8:00 AM Medical Record Number: [id number] Patient Account Number: [id number] Date of Birth/Gender: 07-23-1950 (64 y.o. Male) Treating RN: Montey Hora Primary Care Physician: Bluford Kaufmann Other Clinician: Referring Physician: Bluford Kaufmann Treating Physician/Extender: Frann Rider in Treatment: 6 Education Assessment Education Provided To: Patient Education Topics Provided Venous: Handouts: Other: elevation for edema Methods: Explain/Verbal Responses: State content correctly Electronic Signature(s) Signed: 12/30/2014 4:50:36 PM By: Montey Hora Entered By: Marjory Lies,  Joanna on 12/30/2014 08:58:04 Richard [name], Richard [name] ([id number]) -------------------------------------------------------------------------------- Wound Assessment Details Patient Name: Richard [name], [name] Date of  Service: 12/30/2014 8:00 AM Medical Record Number: [id number] Patient Account Number: [id number] Date of Birth/Sex: [birth year] (64 y.o. Male) Treating RN: Montey Hora Primary Care Physician: Bluford Kaufmann Other Clinician: Referring Physician: Bluford Kaufmann Treating Physician/Extender: Frann Rider in Treatment: 6 Wound Status Wound Number: 3 Primary Etiology: Trauma, Other Wound Location: Right, Lateral Lower Leg Wound Status: Open Wounding Event: Surgical Injury Date Acquired: 10/24/2014 Weeks Of Treatment: 6 Clustered Wound: No Photos Photo Uploaded By: Montey Hora on 12/30/2014 09:13:15 Wound Measurements Length: (cm) Width: (cm) Depth: (cm) Area: (cm) Volume: (cm) 0 % Reduction in Area: 100% 0 % Reduction in Volume: 100% 0 0 0 Wound Description Full Thickness Without Exposed Classification: Support Structures Periwound Skin Texture Texture Color No Abnormalities Noted: No No Abnormalities Noted: No Moisture No Abnormalities Noted: No Electronic Signature(s) Signed: 12/30/2014 4:50:36 PM By: Feliciana Rossetti ([id number]) Entered By: Montey Hora on 12/30/2014 08:25:00 Richard [name] ([id number]) -------------------------------------------------------------------------------- Wound Assessment Details Patient Name: Richard Hidden A. Date of Service: 12/30/2014 8:00 AM Medical Record Number: [id number] Patient Account Number: [id number] Date of Birth/Sex: 03-Dec-1950 (64 y.o. Male) Treating RN: Montey Hora Primary Care Physician: Bluford Kaufmann Other Clinician: Referring Physician: Bluford Kaufmann Treating Physician/Extender: Frann Rider in Treatment: 6 Wound Status Wound Number: 6 Primary Etiology: To be determined Wound Location: Right, Proximal Lower Leg Wound Status: Open Wounding Event: Gradually Appeared Comorbid History: Neuropathy Date Acquired: 12/23/2014 Weeks Of Treatment: 1 Clustered  Wound: No Photos Photo Uploaded By: Montey Hora on 12/30/2014 09:13:15 Wound Measurements Length: (cm) 0.6 Width: (cm) 0.4 Depth: (cm) 0.1 Area: (cm) 0.188 Volume: (cm) 0.019 % Reduction in Area: 93% % Reduction in Volume: 96.5% Epithelialization: None Tunneling: No Undermining: No Wound Description Classification: Partial Thickness Wound Margin: Flat and Intact Exudate Amount: Medium Exudate Type: Sanguinous Exudate Color: red Foul Odor After Cleansing: No Wound Bed Granulation Amount: Large (67-100%) Exposed Structure Granulation Quality: Red Fascia Exposed: No Necrotic Amount: None Present (0%) Fat Layer Exposed: No Tendon Exposed: No Richard [name], [name] ([id number]) Muscle Exposed: No Joint Exposed: No Bone Exposed: No Limited to Skin Breakdown Periwound Skin Texture Texture Color No Abnormalities Noted: No No Abnormalities Noted: No Callus: No Atrophie Blanche: No Crepitus: No Cyanosis: No Excoriation: No Ecchymosis: No Fluctuance: No Erythema: No Friable: No Hemosiderin Staining: No Induration: No Mottled: No Localized Edema: No Pallor: No Rash: No Rubor: No Scarring: No Temperature / Pain Moisture Temperature: No Abnormality No Abnormalities Noted: No Dry / Scaly: No Maceration: No Moist: No Wound Preparation Ulcer Cleansing: Rinsed/Irrigated with Saline Topical Anesthetic Applied: None Treatment Notes Wound #6 (Right, Proximal Lower Leg) 1. Cleansed with: Clean wound with Normal Saline 2. Anesthetic Topical Lidocaine 4% cream to wound bed prior to debridement 4. Dressing Applied: Prisma Ag 5. Secondary Dressing Applied Bordered Foam Dressing 6. Footwear/Offloading device applied Tubigrip Electronic Signature(s) Signed: 12/30/2014 4:50:36 PM By: Montey Hora Entered By: Montey Hora on 12/30/2014 08:34:17 Richard [name] ([id number]) -------------------------------------------------------------------------------- [place]  Details Patient Name: Richard Hidden A. Date of Service: 12/30/2014 8:00 AM Medical Record Number: [id number] Patient Account Number: [id number] Date of Birth/Sex: 23-Aug-1950 (64 y.o. Male) Treating RN: Montey Hora Primary Care Physician: Bluford Kaufmann Other Clinician: Referring Physician: Bluford Kaufmann Treating Physician/Extender: Frann Rider in Treatment: 6 Vital Signs Time Taken: 08:15 Temperature (F): 98.4 Height (in): 68 Pulse (bpm): 62 Weight (lbs): 165 Respiratory Rate (breaths/min): 18 Body  Mass Index (BMI): 25.1 Blood Pressure (mmHg): 126/74 Reference Range: 80 - 120 mg / dl Electronic Signature(s) Signed: 12/30/2014 4:50:36 PM By: Montey Hora Entered By: Montey Hora on 12/30/2014 08:17:30

## 2015-01-06 ENCOUNTER — Encounter: Payer: BLUE CROSS/BLUE SHIELD | Admitting: Surgery

## 2015-01-06 DIAGNOSIS — S81811A Laceration without foreign body, right lower leg, initial encounter: Secondary | ICD-10-CM | POA: Diagnosis not present
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
## 2015-01-06 NOTE — Progress Notes (Signed)
Richard, [name] ([id number]) Visit Report for 01/06/2015 Arrival Information Details Patient Name: Richard [name], Richard [name]. Date of Service: 01/06/2015 8:00 AM Medical Record Number: [id number] Patient Account Number: [id number] Date of Birth/Sex: February 09, 1951 (64 y.o. Male) Treating RN: Cornell Barman Primary Care Physician: Bluford Kaufmann Other Clinician: Referring Physician: Bluford Kaufmann Treating Physician/Extender: Frann Rider in Treatment: 7 Visit Information History Since Last Visit Added or deleted any medications: No Patient Arrived: Wheel Chair Any new allergies or adverse reactions: No Arrival Time: 08:13 Had a fall or experienced change in No Accompanied By: self activities of daily living that may affect Transfer Assistance: None risk of falls: Patient Identification Verified: Yes Signs or symptoms of abuse/neglect No Secondary Verification Process Yes since last visito Completed: Hospitalized since last visit: No Patient Requires Transmission- No Has Dressing in Place as Prescribed: Yes Based Precautions: Has Footwear/Offloading in Place as Yes Patient Has Alerts: Yes Prescribed: Patient Alerts: Patient on Blood Right: Removable Cast Thinner Walker/Walking Boot Xarelto Pain Present Now: No Electronic Signature(s) Signed: 01/06/2015 2:12:38 PM By: Gretta Cool, RN, BSN, Kim RN, BSN Entered By: Gretta Cool, RN, BSN, Kim on 01/06/2015 08:15:45 Richard [name] ([id number]) -------------------------------------------------------------------------------- Clinic Level of Care Assessment Details Patient Name: Richard Hidden A. Date of Service: 01/06/2015 8:00 AM Medical Record Number: [id number] Patient Account Number: [id number] Date of Birth/Sex: 09-13-1950 (64 y.o. Male) Treating RN: Montey Hora Primary Care Physician: Bluford Kaufmann Other Clinician: Referring Physician: Bluford Kaufmann Treating Physician/Extender: Frann Rider in Treatment:  7 Clinic Level of Care Assessment Items TOOL 4 Quantity Score []  - Use when only an EandM is performed on FOLLOW-UP visit 0 ASSESSMENTS - Nursing Assessment / Reassessment X - Reassessment of Co-morbidities (includes updates in patient status) 1 10 X - Reassessment of Adherence to Treatment Plan 1 5 ASSESSMENTS - Wound and Skin Assessment / Reassessment X - Simple Wound Assessment / Reassessment - one wound 1 5 []  - Complex Wound Assessment / Reassessment - multiple wounds 0 []  - Dermatologic / Skin Assessment (not related to wound area) 0 ASSESSMENTS - Focused Assessment X - Circumferential Edema Measurements - multi extremities 1 5 []  - Nutritional Assessment / Counseling / Intervention 0 X - Lower Extremity Assessment (monofilament, tuning fork, pulses) 1 5 []  - Peripheral Arterial Disease Assessment (using hand held doppler) 0 ASSESSMENTS - Ostomy and/or Continence Assessment and Care []  - Incontinence Assessment and Management 0 []  - Ostomy Care Assessment and Management (repouching, etc.) 0 PROCESS - Coordination of Care X - Simple Patient / Family Education for ongoing care 1 15 []  - Complex (extensive) Patient / Family Education for ongoing care 0 []  - Staff obtains Programmer, systems, Records, Test Results / Process Orders 0 []  - Staff telephones HHA, Nursing Homes / Clarify orders / etc 0 []  - Routine Transfer to another Facility (non-emergent condition) 0 Richard [name], [name] ([id number]) []  - Routine Hospital Admission (non-emergent condition) 0 []  - New Admissions / Biomedical engineer / Ordering NPWT, Apligraf, etc. 0 []  - Emergency Hospital Admission (emergent condition) 0 X - Simple Discharge Coordination 1 10 []  - Complex (extensive) Discharge Coordination 0 PROCESS - Special Needs []  - Pediatric / Minor Patient Management 0 []  - Isolation Patient Management 0 []  - Hearing / Language / Visual special needs 0 []  - Assessment of Community assistance (transportation, D/C  planning, etc.) 0 []  - Additional assistance / Altered mentation 0 []  - Support Surface(s) Assessment (bed, cushion, seat, etc.) 0 INTERVENTIONS - Wound Cleansing / Measurement X - Simple Wound Cleansing -  one wound 1 5 []  - Complex Wound Cleansing - multiple wounds 0 X - Wound Imaging (photographs - any number of wounds) 1 5 []  - Wound Tracing (instead of photographs) 0 X - Simple Wound Measurement - one wound 1 5 []  - Complex Wound Measurement - multiple wounds 0 INTERVENTIONS - Wound Dressings X - Small Wound Dressing one or multiple wounds 1 10 []  - Medium Wound Dressing one or multiple wounds 0 []  - Large Wound Dressing one or multiple wounds 0 []  - Application of Medications - topical 0 []  - Application of Medications - injection 0 INTERVENTIONS - Miscellaneous []  - External ear exam 0 [name], [name] ([id number]) []  - Specimen Collection (cultures, biopsies, blood, body fluids, etc.) 0 []  - Specimen(s) / Culture(s) sent or taken to Lab for analysis 0 []  - Patient Transfer (multiple staff / Harrel Lemon Lift / Similar devices) 0 []  - Simple Staple / Suture removal (25 or less) 0 []  - Complex Staple / Suture removal (26 or more) 0 []  - Hypo / Hyperglycemic Management (close monitor of Blood Glucose) 0 []  - Ankle / Brachial Index (ABI) - do not check if billed separately 0 X - Vital Signs 1 5 Has the patient been seen at the hospital within the last three years: Yes Total Score: 85 Level Of Care: New/Established - Level 3 Electronic Signature(s) Signed: 01/06/2015 8:34:48 AM By: Montey Hora Entered By: Montey Hora on 01/06/2015 08:34:48 Richard [name] ([id number]) -------------------------------------------------------------------------------- Encounter Discharge Information Details Patient Name: Richard Hidden A. Date of Service: 01/06/2015 8:00 AM Medical Record Number: [id number] Patient Account Number: [id number] Date of Birth/Sex: 05-30-1950 (64 y.o. Male) Treating RN:  Cornell Barman Primary Care Physician: Bluford Kaufmann Other Clinician: Referring Physician: Bluford Kaufmann Treating Physician/Extender: Frann Rider in Treatment: 7 Encounter Discharge Information Items Discharge Pain Level: 0 Discharge Condition: Stable Ambulatory Status: Wheelchair Discharge Destination: Nursing Home Transportation: Private Auto caregiver in Accompanied By: lobby Schedule Follow-up Appointment: Yes Medication Reconciliation completed and provided to Patient/Care Yes Ilayda Toda: Provided on Clinical Summary of Care: 01/06/2015 Form Type Recipient Paper Patient [facility] Electronic Signature(s) Signed: 01/06/2015 8:41:14 AM By: Ruthine Dose Entered By: Ruthine Dose on 01/06/2015 08:41:14 Richard [name] ([id number]) -------------------------------------------------------------------------------- Lower Extremity Assessment Details Patient Name: Richard Hidden A. Date of Service: 01/06/2015 8:00 AM Medical Record Number: [id number] Patient Account Number: [id number] Date of Birth/Sex: [birth year] (64 y.o. Male) Treating RN: Cornell Barman Primary Care Physician: Bluford Kaufmann Other Clinician: Referring Physician: Bluford Kaufmann Treating Physician/Extender: Frann Rider in Treatment: 7 Edema Assessment Assessed: Shirlyn Goltz: No] [Right: No] E[Left: dema] [Right: :] Calf Left: Right: Point of Measurement: 32 cm From Medial Instep cm 36.5 cm Ankle Left: Right: Point of Measurement: 8 cm From Medial Instep cm 287 cm Vascular Assessment Pulses: Posterior Tibial Dorsalis Pedis Palpable: [Right:Yes] Extremity colors, hair growth, and conditions: Extremity Color: [Right:Normal] Hair Growth on Extremity: [Right:Yes] Temperature of Extremity: [Right:Warm] Capillary Refill: [Right:< 3 seconds] Toe Nail Assessment Left: Right: Thick: No Discolored: No Deformed: No Improper Length and Hygiene: No Electronic Signature(s) Signed: 01/06/2015  2:12:38 PM By: Gretta Cool, RN, BSN, Kim RN, BSN Entered By: Gretta Cool, RN, BSN, Kim on 01/06/2015 08:18:04 Richard [name] ([id number]) -------------------------------------------------------------------------------- Multi Wound Chart Details Patient Name: Richard Hidden A. Date of Service: 01/06/2015 8:00 AM Medical Record Number: [id number] Patient Account Number: [id number] Date of Birth/Sex: 12/01/1950 (64 y.o. Male) Treating RN: Montey Hora Primary Care Physician: Bluford Kaufmann Other Clinician: Referring Physician: Bluford Kaufmann Treating Physician/Extender: Frann Rider in Treatment:  7 Vital Signs Height(in): 68 Pulse(bpm): 71 Weight(lbs): 165 Blood Pressure 144/86 (mmHg): Body Mass Index(BMI): 25 Temperature(F): 98.1 Respiratory Rate 18 (breaths/min): Photos: [6:No Photos] [N/A:N/A] Wound Location: [6:Right Lower Leg - Proximal] [N/A:N/A] Wounding Event: [6:Gradually Appeared] [N/A:N/A] Primary Etiology: [6:Trauma, Other] [N/A:N/A] Comorbid History: [6:Neuropathy] [N/A:N/A] Date Acquired: [6:12/23/2014] [N/A:N/A] Weeks of Treatment: [6:2] [N/A:N/A] Wound Status: [6:Open] [N/A:N/A] Measurements L x W x D 0.5x0.3x0.1 [N/A:N/A] (cm) Area (cm) : [6:0.118] [N/A:N/A] Volume (cm) : [6:0.012] [N/A:N/A] % Reduction in Area: [6:95.60%] [N/A:N/A] % Reduction in Volume: 97.80% [N/A:N/A] Classification: [6:Partial Thickness] [N/A:N/A] Exudate Amount: [6:Medium] [N/A:N/A] Exudate Type: [6:Sanguinous] [N/A:N/A] Exudate Color: [6:red] [N/A:N/A] Wound Margin: [6:Flat and Intact] [N/A:N/A] Granulation Amount: [6:Large (67-100%)] [N/A:N/A] Granulation Quality: [6:Red] [N/A:N/A] Necrotic Amount: [6:None Present (0%)] [N/A:N/A] Exposed Structures: [6:Fascia: No Fat: No Tendon: No Muscle: No Joint: No Bone: No] [N/A:N/A] Limited to Skin Breakdown Epithelialization: None N/A N/A Periwound Skin Texture: Edema: No N/A N/A Excoriation: No Induration: No Callus:  No Crepitus: No Fluctuance: No Friable: No Rash: No Scarring: No Periwound Skin Maceration: No N/A N/A Moisture: Moist: No Dry/Scaly: No Periwound Skin Color: Atrophie Blanche: No N/A N/A Cyanosis: No Ecchymosis: No Erythema: No Hemosiderin Staining: No Mottled: No Pallor: No Rubor: No Temperature: No Abnormality N/A N/A Tenderness on No N/A N/A Palpation: Wound Preparation: Ulcer Cleansing: N/A N/A Rinsed/Irrigated with Saline Topical Anesthetic Applied: None Treatment Notes Electronic Signature(s) Signed: 01/06/2015 12:21:34 PM By: Montey Hora Entered By: Montey Hora on 01/06/2015 86:76:19 Richard [name] ([id number]) -------------------------------------------------------------------------------- [place] Details Patient Name: Richard Hidden A. Date of Service: 01/06/2015 8:00 AM Medical Record Number: [id number] Patient Account Number: [id number] Date of Birth/Sex: March 23, 1951 (64 y.o. Male) Treating RN: Cornell Barman Primary Care Physician: Bluford Kaufmann Other Clinician: Referring Physician: Bluford Kaufmann Treating Physician/Extender: Frann Rider in Treatment: 7 Active Inactive Orientation to the Wound Care Program Nursing Diagnoses: Knowledge deficit related to the wound healing center program Goals: Patient/caregiver will verbalize understanding of the [facility] Date Initiated: 11/17/2014 Goal Status: Active Interventions: Provide education on orientation to the wound center Notes: Wound/Skin Impairment Nursing Diagnoses: Impaired tissue integrity Knowledge deficit related to ulceration/compromised skin integrity Goals: Patient/caregiver will verbalize understanding of skin care regimen Date Initiated: 11/17/2014 Goal Status: Active Ulcer/skin breakdown will have a volume reduction of 30% by week 4 Date Initiated: 11/17/2014 Goal Status: Active Ulcer/skin breakdown will have a volume reduction  of 50% by week 8 Date Initiated: 11/17/2014 Goal Status: Active Ulcer/skin breakdown will have a volume reduction of 80% by week 12 Date Initiated: 11/17/2014 Goal Status: Active Ulcer/skin breakdown will heal within 14 weeks Date Initiated: 11/17/2014 Richard [name], Richard [name] ([id number]) Goal Status: Active Interventions: Assess patient/caregiver ability to obtain necessary supplies Assess patient/caregiver ability to perform ulcer/skin care regimen upon admission and as needed Assess ulceration(s) every visit Provide education on ulcer and skin care Treatment Activities: Skin care regimen initiated : 01/06/2015 Topical wound management initiated : 01/06/2015 Notes: Electronic Signature(s) Signed: 01/06/2015 12:21:34 PM By: Montey Hora Signed: 01/06/2015 2:12:38 PM By: Gretta Cool RN, BSN, Kim RN, BSN Entered By: Montey Hora on 01/06/2015 08:26:08 Richard [name] ([id number]) -------------------------------------------------------------------------------- Pain Assessment Details Patient Name: Richard Hidden A. Date of Service: 01/06/2015 8:00 AM Medical Record Number: [id number] Patient Account Number: [id number] Date of Birth/Sex: 06-09-1950 (64 y.o. Male) Treating RN: Cornell Barman Primary Care Physician: Bluford Kaufmann Other Clinician: Referring Physician: Bluford Kaufmann Treating Physician/Extender: Frann Rider in Treatment: 7 Active Problems Location of Pain Severity and Description of Pain Patient  Has Paino No Site Locations Pain Management and Medication Current Pain Management: Notes Pain in shoulder only Electronic Signature(s) Signed: 01/06/2015 2:12:38 PM By: Gretta Cool, RN, BSN, Kim RN, BSN Entered By: Gretta Cool, RN, BSN, Kim on 01/06/2015 08:16:01 Richard [name] ([id number]) -------------------------------------------------------------------------------- Patient/Caregiver Education Details Patient Name: Richard Hidden A. Date of Service: 01/06/2015 8:00 AM Medical  Record Number: [id number] Patient Account Number: [id number] Date of Birth/Gender: [birth year] (64 y.o. Male) Treating RN: Cornell Barman Primary Care Physician: Bluford Kaufmann Other Clinician: Referring Physician: Bluford Kaufmann Treating Physician/Extender: Frann Rider in Treatment: 7 Education Assessment Education Provided To: Patient Education Topics Provided Wound/Skin Impairment: Handouts: Caring for Your Ulcer, Other: continue wound care as precribed Methods: Demonstration, Explain/Verbal Responses: State content correctly Electronic Signature(s) Signed: 01/06/2015 2:12:38 PM By: Gretta Cool, RN, BSN, Kim RN, BSN Entered By: Gretta Cool, RN, BSN, Kim on 01/06/2015 08:38:26 Richard [name] ([id number]) -------------------------------------------------------------------------------- Wound Assessment Details Patient Name: Richard Hidden A. Date of Service: 01/06/2015 8:00 AM Medical Record Number: [id number] Patient Account Number: [id number] Date of Birth/Sex: June 30, 1950 (64 y.o. Male) Treating RN: Cornell Barman Primary Care Physician: Bluford Kaufmann Other Clinician: Referring Physician: Bluford Kaufmann Treating Physician/Extender: Frann Rider in Treatment: 7 Wound Status Wound Number: 6 Primary Etiology: Trauma, Other Wound Location: Right Lower Leg - Proximal Wound Status: Open Wounding Event: Gradually Appeared Comorbid History: Neuropathy Date Acquired: 12/23/2014 Weeks Of Treatment: 2 Clustered Wound: No Photos Photo Uploaded By: Gretta Cool, RN, BSN, Kim on 01/06/2015 11:11:43 Wound Measurements Length: (cm) 0.5 Width: (cm) 0.3 Depth: (cm) 0.1 Area: (cm) 0.118 Volume: (cm) 0.012 % Reduction in Area: 95.6% % Reduction in Volume: 97.8% Epithelialization: None Wound Description Classification: Partial Thickness Wound Margin: Flat and Intact Exudate Amount: Medium Exudate Type: Sanguinous Exudate Color: red Foul Odor After Cleansing: No Wound  Bed Granulation Amount: Large (67-100%) Exposed Structure Granulation Quality: Red Fascia Exposed: No Necrotic Amount: None Present (0%) Fat Layer Exposed: No Tendon Exposed: No Richard [name], [name] ([id number]) Muscle Exposed: No Joint Exposed: No Bone Exposed: No Limited to Skin Breakdown Periwound Skin Texture Texture Color No Abnormalities Noted: No No Abnormalities Noted: No Callus: No Atrophie Blanche: No Crepitus: No Cyanosis: No Excoriation: No Ecchymosis: No Fluctuance: No Erythema: No Friable: No Hemosiderin Staining: No Induration: No Mottled: No Localized Edema: No Pallor: No Rash: No Rubor: No Scarring: No Temperature / Pain Moisture Temperature: No Abnormality No Abnormalities Noted: No Dry / Scaly: No Maceration: No Moist: No Wound Preparation Ulcer Cleansing: Rinsed/Irrigated with Saline Topical Anesthetic Applied: None Treatment Notes Wound #6 (Right, Proximal Lower Leg) 1. Cleansed with: Clean wound with Normal Saline 4. Dressing Applied: Prisma Ag 5. Secondary Dressing Applied Bordered Foam Dressing 6. Footwear/Offloading device applied Ace wrap Removable Cast Scientific laboratory technician) Signed: 01/06/2015 2:12:38 PM By: Gretta Cool, RN, BSN, Kim RN, BSN Entered By: Gretta Cool, RN, BSN, Kim on 01/06/2015 08:19:46 Richard [name] ([id number]) -------------------------------------------------------------------------------- [place] Details Patient Name: Richard Hidden A. Date of Service: 01/06/2015 8:00 AM Medical Record Number: [id number] Patient Account Number: [id number] Date of Birth/Sex: Dec 15, 1950 (64 y.o. Male) Treating RN: Cornell Barman Primary Care Physician: Bluford Kaufmann Other Clinician: Referring Physician: Bluford Kaufmann Treating Physician/Extender: Frann Rider in Treatment: 7 Vital Signs Time Taken: 08:16 Temperature (F): 98.1 Height (in): 68 Pulse (bpm): 71 Weight (lbs): 165 Respiratory Rate  (breaths/min): 18 Body Mass Index (BMI): 25.1 Blood Pressure (mmHg): 144/86 Reference Range: 80 - 120 mg / dl Electronic Signature(s) Signed: 01/06/2015 2:12:38 PM By: Gretta Cool, RN, BSN, Kim RN, BSN Entered By: Gretta Cool,  RN, BSN, Kim on 01/06/2015 08:16:20

## 2015-01-13 ENCOUNTER — Encounter: Payer: BLUE CROSS/BLUE SHIELD | Admitting: Surgery

## 2015-01-13 DIAGNOSIS — S81811A Laceration without foreign body, right lower leg, initial encounter: Secondary | ICD-10-CM | POA: Diagnosis not present
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
## 2015-01-14 NOTE — Progress Notes (Signed)
DEQUAVION, FOLLETTE ([id number]) Visit Report for 01/13/2015 Arrival Information Details Patient Name: Richard [name], Richard [name]. Date of Service: 01/13/2015 8:45 AM Medical Record Number: [id number] Patient Account Number: [id number] Date of Birth/Sex: 1951-01-28 (64 y.o. Male) Treating RN: Cornell Barman Primary Care Physician: Bluford Kaufmann Other Clinician: Referring Physician: Bluford Kaufmann Treating Physician/Extender: Frann Rider in Treatment: 8 Visit Information History Since Last Visit Added or deleted any medications: No Patient Arrived: Wheel Chair Any new allergies or adverse reactions: No Arrival Time: 08:48 Had a fall or experienced change in No Accompanied By: self activities of daily living that may affect Transfer Assistance: None risk of falls: Patient Identification Verified: Yes Signs or symptoms of abuse/neglect No Secondary Verification Process Yes since last visito Completed: Hospitalized since last visit: No Patient Requires Transmission- No Has Dressing in Place as Prescribed: Yes Based Precautions: Has Compression in Place as Yes Patient Has Alerts: Yes Prescribed: Patient Alerts: Patient on Blood Has Footwear/Offloading in Place as Yes Thinner Prescribed: Xarelto Right: Removable Cast Walker/Walking Boot Pain Present Now: No Electronic Signature(s) Signed: 01/13/2015 5:26:34 PM By: Gretta Cool, RN, BSN, Kim RN, BSN Entered By: Gretta Cool, RN, BSN, Kim on 01/13/2015 08:49:17 Richard [name] ([id number]) -------------------------------------------------------------------------------- Encounter Discharge Information Details Patient Name: Richard Hidden A. Date of Service: 01/13/2015 8:45 AM Medical Record Number: [id number] Patient Account Number: [id number] Date of Birth/Sex: 07-25-1950 (64 y.o. Male) Treating RN: Montey Hora Primary Care Physician: Bluford Kaufmann Other Clinician: Referring Physician: Bluford Kaufmann Treating  Physician/Extender: Frann Rider in Treatment: 8 Encounter Discharge Information Items Discharge Pain Level: 0 Discharge Condition: Stable Ambulatory Status: Wheelchair Discharge Destination: Home Transportation: Private Auto Accompanied By: self Schedule Follow-up Appointment: Yes Medication Reconciliation completed and provided to Patient/Care Yes Provider: Provided on Clinical Summary of Care: 01/13/2015 Form Type Recipient Paper [facility] Electronic Signature(s) Signed: 01/13/2015 5:26:34 PM By: Gretta Cool RN, BSN, Kim RN, BSN Previous Signature: 01/13/2015 9:09:45 AM Version By: Ruthine Dose Entered By: Gretta Cool RN, BSN, Kim on 01/13/2015 09:14:20 Richard [name] ([id number]) -------------------------------------------------------------------------------- Lower Extremity Assessment Details Patient Name: Richard Hidden A. Date of Service: 01/13/2015 8:45 AM Medical Record Number: [id number] Patient Account Number: [id number] Date of Birth/Sex: Jul 04, 1950 (64 y.o. Male) Treating RN: Cornell Barman Primary Care Physician: Bluford Kaufmann Other Clinician: Referring Physician: Bluford Kaufmann Treating Physician/Extender: Frann Rider in Treatment: 8 Edema Assessment Assessed: [Left: No] [Right: No] Edema: [Left: N] [Right: o] Vascular Assessment Pulses: Posterior Tibial Dorsalis Pedis Palpable: [Right:Yes] Extremity colors, hair growth, and conditions: Extremity Color: [Right:Pale] Hair Growth on Extremity: [Right:Yes] Temperature of Extremity: [Right:Warm] Capillary Refill: [Right:< 3 seconds] Toe Nail Assessment Left: Right: Thick: No Discolored: No Deformed: No Improper Length and Hygiene: No Electronic Signature(s) Signed: 01/13/2015 5:26:34 PM By: Gretta Cool, RN, BSN, Kim RN, BSN Entered By: Gretta Cool, RN, BSN, Kim on 01/13/2015 08:51:54 Richard [name] ([id number]) -------------------------------------------------------------------------------- Multi  Wound Chart Details Patient Name: Richard Hidden A. Date of Service: 01/13/2015 8:45 AM Medical Record Number: [id number] Patient Account Number: [id number] Date of Birth/Sex: 27-Jul-1950 (64 y.o. Male) Treating RN: Montey Hora Primary Care Physician: Bluford Kaufmann Other Clinician: Referring Physician: Bluford Kaufmann Treating Physician/Extender: Frann Rider in Treatment: 8 Vital Signs Height(in): 68 Pulse(bpm): 82 Weight(lbs): 165 Blood Pressure 116/70 (mmHg): Body Mass Index(BMI): 25 Temperature(F): 98.3 Respiratory Rate 18 (breaths/min): Photos: [6:No Photos] [7:No Photos] [N/A:N/A] Wound Location: [6:Right Lower Leg - Proximal] [7:Right Lower Leg - Distal] [N/A:N/A] Wounding Event: [6:Gradually Appeared] [7:Trauma] [N/A:N/A] Primary Etiology: [6:Trauma, Other] [7:Trauma, Other] [N/A:N/A] Comorbid History: [6:Neuropathy] [7:Neuropathy] [N/A:N/A] Date  Acquired: [6:12/23/2014] [7:01/12/2015] [N/A:N/A] Weeks of Treatment: [6:3] [7:0] [N/A:N/A] Wound Status: [6:Healed - Epithelialized] [7:Open] [N/A:N/A] Clustered Wound: [6:No] [7:Yes] [N/A:N/A] Measurements L x W x D 0x0x0 [7:2.5x5x0.1] [N/A:N/A] (cm) Area (cm) : [6:0] [7:6.195] [N/A:N/A] Volume (cm) : [6:0] [7:0.982] [N/A:N/A] % Reduction in Area: [6:100.00%] [7:0.00%] [N/A:N/A] % Reduction in Volume: 100.00% [7:0.00%] [N/A:N/A] Classification: [6:Partial Thickness] [7:Partial Thickness] [N/A:N/A] Exudate Amount: [6:Medium] [7:Medium] [N/A:N/A] Exudate Type: [6:Sanguinous] [7:Serous] [N/A:N/A] Exudate Color: [6:red] [7:amber] [N/A:N/A] Wound Margin: [6:Flat and Intact] [7:Indistinct, nonvisible] [N/A:N/A] Granulation Amount: [6:Large (67-100%)] [7:Medium (34-66%)] [N/A:N/A] Granulation Quality: [6:Red] [7:N/A] [N/A:N/A] Necrotic Amount: [6:None Present (0%)] [7:None Present (0%)] [N/A:N/A] Exposed Structures: [6:Fascia: No Fat: No Tendon: No Muscle: No Joint: No Bone: No] [7:Fascia: No Fat: No Tendon:  No Muscle: No Joint: No Bone: No] [N/A:N/A] Limited to Skin Limited to Skin Breakdown Breakdown Epithelialization: Large (67-100%) N/A N/A Periwound Skin Texture: Edema: No Edema: No N/A Excoriation: No Excoriation: No Induration: No Induration: No Callus: No Callus: No Crepitus: No Crepitus: No Fluctuance: No Fluctuance: No Friable: No Friable: No Rash: No Rash: No Scarring: No Scarring: No Periwound Skin Maceration: No Moist: Yes N/A Moisture: Moist: No Maceration: No Dry/Scaly: No Dry/Scaly: No Periwound Skin Color: Atrophie Blanche: No Atrophie Blanche: No N/A Cyanosis: No Cyanosis: No Ecchymosis: No Ecchymosis: No Erythema: No Erythema: No Hemosiderin Staining: No Hemosiderin Staining: No Mottled: No Mottled: No Pallor: No Pallor: No Rubor: No Rubor: No Temperature: No Abnormality N/A N/A Tenderness on No No N/A Palpation: Wound Preparation: Ulcer Cleansing: Ulcer Cleansing: N/A Rinsed/Irrigated with Rinsed/Irrigated with Saline Saline Topical Anesthetic Topical Anesthetic Applied: None Applied: None Treatment Notes Electronic Signature(s) Signed: 01/13/2015 4:21:27 PM By: Montey Hora Entered By: Montey Hora on 01/13/2015 Richard [name], Alger. ([id number]) -------------------------------------------------------------------------------- [place] Details Patient Name: Richard Hidden A. Date of Service: 01/13/2015 8:45 AM Medical Record Number: [id number] Patient Account Number: [id number] Date of Birth/Sex: Apr 17, 1951 (64 y.o. Male) Treating RN: Montey Hora Primary Care Physician: Bluford Kaufmann Other Clinician: Referring Physician: Bluford Kaufmann Treating Physician/Extender: Frann Rider in Treatment: 8 Active Inactive Orientation to the Wound Care Program Nursing Diagnoses: Knowledge deficit related to the wound healing center program Goals: Patient/caregiver will verbalize understanding of  the [facility] Date Initiated: 11/17/2014 Goal Status: Active Interventions: Provide education on orientation to the wound center Notes: Wound/Skin Impairment Nursing Diagnoses: Impaired tissue integrity Knowledge deficit related to ulceration/compromised skin integrity Goals: Patient/caregiver will verbalize understanding of skin care regimen Date Initiated: 11/17/2014 Goal Status: Active Ulcer/skin breakdown will have a volume reduction of 30% by week 4 Date Initiated: 11/17/2014 Goal Status: Active Ulcer/skin breakdown will have a volume reduction of 50% by week 8 Date Initiated: 11/17/2014 Goal Status: Active Ulcer/skin breakdown will have a volume reduction of 80% by week 12 Date Initiated: 11/17/2014 Goal Status: Active Ulcer/skin breakdown will heal within 14 weeks Date Initiated: 11/17/2014 LEOBARDO, GRANLUND ([id number]) Goal Status: Active Interventions: Assess patient/caregiver ability to obtain necessary supplies Assess patient/caregiver ability to perform ulcer/skin care regimen upon admission and as needed Assess ulceration(s) every visit Provide education on ulcer and skin care Treatment Activities: Skin care regimen initiated : 01/13/2015 Topical wound management initiated : 01/13/2015 Notes: Electronic Signature(s) Signed: 01/13/2015 4:21:27 PM By: Montey Hora Entered By: Montey Hora on 01/13/2015 09:00:16 Richard [name] ([id number]) -------------------------------------------------------------------------------- Patient/Caregiver Education Details Patient Name: Richard Hidden A. Date of Service: 01/13/2015 8:45 AM Medical Record Number: [id number] Patient Account Number: [id number] Date of Birth/Gender: 1951-03-17 (64 y.o. Male) Treating RN: Gretta Cool,  Maudie Mercury Primary Care Physician: Bluford Kaufmann Other Clinician: Referring Physician: Bluford Kaufmann Treating Physician/Extender: Frann Rider in Treatment: 8 Education  Assessment Education Provided To: Patient Education Topics Provided Wound/Skin Impairment: Handouts: Caring for Your Ulcer, Other: continue wound care as prescribed Methods: Demonstration, Explain/Verbal, Printed Responses: State content correctly Electronic Signature(s) Signed: 01/13/2015 5:26:34 PM By: Gretta Cool, RN, BSN, Kim RN, BSN Entered By: Gretta Cool, RN, BSN, Kim on 01/13/2015 09:14:48 Richard [name] ([id number]) -------------------------------------------------------------------------------- Wound Assessment Details Patient Name: Richard Hidden A. Date of Service: 01/13/2015 8:45 AM Medical Record Number: [id number] Patient Account Number: [id number] Date of Birth/Sex: [birth year] (64 y.o. Male) Treating RN: Cornell Barman Primary Care Physician: Bluford Kaufmann Other Clinician: Referring Physician: Bluford Kaufmann Treating Physician/Extender: Frann Rider in Treatment: 8 Wound Status Wound Number: 6 Primary Etiology: Trauma, Other Wound Location: Right Lower Leg - Proximal Wound Status: Healed - Epithelialized Wounding Event: Gradually Appeared Comorbid History: Neuropathy Date Acquired: 12/23/2014 Weeks Of Treatment: 3 Clustered Wound: No Photos Photo Uploaded By: Gretta Cool, RN, BSN, Kim on 01/13/2015 11:43:42 Wound Measurements Length: (cm) 0 Width: (cm) 0 Depth: (cm) 0 Area: (cm) 0 Volume: (cm) 0 % Reduction in Area: 100% % Reduction in Volume: 100% Epithelialization: Large (67-100%) Wound Description Classification: Partial Thickness Foul Odor Aft Wound Margin: Flat and Intact Exudate Amount: Medium Exudate Type: Sanguinous Exudate Color: red er Cleansing: No Wound Bed Granulation Amount: Large (67-100%) Exposed Structure Granulation Quality: Red Fascia Exposed: No Necrotic Amount: None Present (0%) Fat Layer Exposed: No Tendon Exposed: No Richard [name], [name] ([id number]) Muscle Exposed: No Joint Exposed: No Bone Exposed: No Limited to Skin  Breakdown Periwound Skin Texture Texture Color No Abnormalities Noted: No No Abnormalities Noted: No Callus: No Atrophie Blanche: No Crepitus: No Cyanosis: No Excoriation: No Ecchymosis: No Fluctuance: No Erythema: No Friable: No Hemosiderin Staining: No Induration: No Mottled: No Localized Edema: No Pallor: No Rash: No Rubor: No Scarring: No Temperature / Pain Moisture Temperature: No Abnormality No Abnormalities Noted: No Dry / Scaly: No Maceration: No Moist: No Wound Preparation Ulcer Cleansing: Rinsed/Irrigated with Saline Topical Anesthetic Applied: None Electronic Signature(s) Signed: 01/13/2015 5:26:34 PM By: Gretta Cool, RN, BSN, Kim RN, BSN Entered By: Gretta Cool, RN, BSN, Kim on 01/13/2015 08:55:26 Richard [name] ([id number]) -------------------------------------------------------------------------------- Wound Assessment Details Patient Name: Richard Hidden A. Date of Service: 01/13/2015 8:45 AM Medical Record Number: [id number] Patient Account Number: [id number] Date of Birth/Sex: 09-28-1950 (64 y.o. Male) Treating RN: Cornell Barman Primary Care Physician: Bluford Kaufmann Other Clinician: Referring Physician: Bluford Kaufmann Treating Physician/Extender: Frann Rider in Treatment: 8 Wound Status Wound Number: 7 Primary Etiology: Trauma, Other Wound Location: Right Lower Leg - Distal Wound Status: Open Wounding Event: Trauma Comorbid History: Neuropathy Date Acquired: 01/12/2015 Weeks Of Treatment: 0 Clustered Wound: Yes Photos Photo Uploaded By: Gretta Cool, RN, BSN, Kim on 01/13/2015 11:43:43 Wound Measurements Length: (cm) 2.5 Width: (cm) 5 Depth: (cm) 0.1 Area: (cm) 9.817 Volume: (cm) 0.982 % Reduction in Area: 0% % Reduction in Volume: 0% Wound Description Classification: Partial Thickness Wound Margin: Indistinct, nonvisible Exudate Amount: Medium Exudate Type: Serous Exudate Color: amber Wound Bed Granulation Amount: Medium (34-66%)  Exposed Structure Necrotic Amount: None Present (0%) Fascia Exposed: No Fat Layer Exposed: No Tendon Exposed: No Richard [name], [name] ([id number]) Muscle Exposed: No Joint Exposed: No Bone Exposed: No Limited to Skin Breakdown Periwound Skin Texture Texture Color No Abnormalities Noted: No No Abnormalities Noted: No Callus: No Atrophie Blanche: No Crepitus: No Cyanosis: No Excoriation: No Ecchymosis: No Fluctuance: No Erythema: No Friable:  No Hemosiderin Staining: No Induration: No Mottled: No Localized Edema: No Pallor: No Rash: No Rubor: No Scarring: No Moisture No Abnormalities Noted: No Dry / Scaly: No Maceration: No Moist: Yes Wound Preparation Ulcer Cleansing: Rinsed/Irrigated with Saline Topical Anesthetic Applied: None Treatment Notes Wound #7 (Right, Distal Lower Leg) 1. Cleansed with: Clean wound with Normal Saline 4. Dressing Applied: Aquacel Ag 5. Secondary Dressing Applied ABD Pad 7. Secured with Other (specify in notes) Notes ace wrap from base of toes to below knee Electronic Signature(s) Signed: 01/13/2015 5:26:34 PM By: Gretta Cool, RN, BSN, Kim RN, BSN Entered By: Gretta Cool, RN, BSN, Kim on 01/13/2015 08:56:27 Richard [name] ([id number]) -------------------------------------------------------------------------------- Vitals Details Patient Name: Richard Hidden A. Date of Service: 01/13/2015 8:45 AM Medical Record Number: [id number] Patient Account Number: [id number] Date of Birth/Sex: 1950/07/22 (64 y.o. Male) Treating RN: Cornell Barman Primary Care Physician: Bluford Kaufmann Other Clinician: Referring Physician: Bluford Kaufmann Treating Physician/Extender: Frann Rider in Treatment: 8 Vital Signs Time Taken: 08:49 Temperature (F): 98.3 Height (in): 68 Pulse (bpm): 82 Weight (lbs): 165 Respiratory Rate (breaths/min): 18 Body Mass Index (BMI): 25.1 Blood Pressure (mmHg): 116/70 Reference Range: 80 - 120 mg / dl Electronic  Signature(s) Signed: 01/13/2015 5:26:34 PM By: Gretta Cool, RN, BSN, Kim RN, BSN Entered By: Gretta Cool, RN, BSN, Kim on 01/13/2015 08:49:52

## 2015-01-20 ENCOUNTER — Encounter: Payer: BLUE CROSS/BLUE SHIELD | Attending: Surgery | Admitting: Surgery

## 2015-01-20 DIAGNOSIS — T8131XD Disruption of external operation (surgical) wound, not elsewhere classified, subsequent encounter: Secondary | ICD-10-CM | POA: Diagnosis not present

## 2015-01-20 DIAGNOSIS — S81811D Laceration without foreign body, right lower leg, subsequent encounter: Secondary | ICD-10-CM | POA: Diagnosis not present

## 2015-01-20 DIAGNOSIS — X58XXXD Exposure to other specified factors, subsequent encounter: Secondary | ICD-10-CM | POA: Insufficient documentation

## 2015-01-21 ENCOUNTER — Telehealth: Payer: Self-pay | Admitting: Internal Medicine
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
## 2015-01-21 NOTE — Progress Notes (Signed)
WILLET, SCHLEIFER ([id number]) Visit Report for 01/20/2015 Arrival Information Details Patient Name: Richard [name], Richard [name]. Date of Service: 01/20/2015 8:00 AM Medical Record Number: [id number] Patient Account Number: [id number] Date of Birth/Sex: 11/09/1950 (64 y.o. Male) Treating RN: Montey Hora Primary Care Physician: Bluford Kaufmann Other Clinician: Referring Physician: Bluford Kaufmann Treating Physician/Extender: Frann Rider in Treatment: 9 Visit Information History Since Last Visit Added or deleted any medications: No Patient Arrived: Ambulatory Any new allergies or adverse reactions: No Arrival Time: 08:14 Had a fall or experienced change in No Accompanied By: self activities of daily living that may affect Transfer Assistance: None risk of falls: Patient Identification Verified: Yes Signs or symptoms of abuse/neglect since last No Secondary Verification Process Yes visito Completed: Hospitalized since last visit: No Patient Requires Transmission- No Pain Present Now: No Based Precautions: Patient Has Alerts: Yes Patient Alerts: Patient on Blood Thinner Xarelto Electronic Signature(s) Signed: 01/20/2015 5:04:53 PM By: Montey Hora Entered By: Montey Hora on 01/20/2015 08:14:51 Richard [name] ([id number]) -------------------------------------------------------------------------------- Clinic Level of Care Assessment Details Patient Name: Richard Hidden A. Date of Service: 01/20/2015 8:00 AM Medical Record Number: [id number] Patient Account Number: [id number] Date of Birth/Sex: [birth year] (64 y.o. Male) Treating RN: Montey Hora Primary Care Physician: Bluford Kaufmann Other Clinician: Referring Physician: Bluford Kaufmann Treating Physician/Extender: Frann Rider in Treatment: 9 Clinic Level of Care Assessment Items TOOL 4 Quantity Score []  - Use when only an EandM is performed on FOLLOW-UP visit 0 ASSESSMENTS - Nursing Assessment  / Reassessment X - Reassessment of Co-morbidities (includes updates in patient status) 1 10 X - Reassessment of Adherence to Treatment Plan 1 5 ASSESSMENTS - Wound and Skin Assessment / Reassessment X - Simple Wound Assessment / Reassessment - one wound 1 5 []  - Complex Wound Assessment / Reassessment - multiple wounds 0 []  - Dermatologic / Skin Assessment (not related to wound area) 0 ASSESSMENTS - Focused Assessment []  - Circumferential Edema Measurements - multi extremities 0 []  - Nutritional Assessment / Counseling / Intervention 0 X - Lower Extremity Assessment (monofilament, tuning fork, pulses) 1 5 []  - Peripheral Arterial Disease Assessment (using hand held doppler) 0 ASSESSMENTS - Ostomy and/or Continence Assessment and Care []  - Incontinence Assessment and Management 0 []  - Ostomy Care Assessment and Management (repouching, etc.) 0 PROCESS - Coordination of Care X - Simple Patient / Family Education for ongoing care 1 15 []  - Complex (extensive) Patient / Family Education for ongoing care 0 []  - Staff obtains Programmer, systems, Records, Test Results / Process Orders 0 []  - Staff telephones HHA, Nursing Homes / Clarify orders / etc 0 []  - Routine Transfer to another Facility (non-emergent condition) 0 Richard [name], [name] ([id number]) []  - Routine Hospital Admission (non-emergent condition) 0 []  - New Admissions / Biomedical engineer / Ordering NPWT, Apligraf, etc. 0 []  - Emergency Hospital Admission (emergent condition) 0 X - Simple Discharge Coordination 1 10 []  - Complex (extensive) Discharge Coordination 0 PROCESS - Special Needs []  - Pediatric / Minor Patient Management 0 []  - Isolation Patient Management 0 []  - Hearing / Language / Visual special needs 0 []  - Assessment of Community assistance (transportation, D/C planning, etc.) 0 []  - Additional assistance / Altered mentation 0 []  - Support Surface(s) Assessment (bed, cushion, seat, etc.) 0 INTERVENTIONS - Wound Cleansing /  Measurement X - Simple Wound Cleansing - one wound 1 5 []  - Complex Wound Cleansing - multiple wounds 0 X - Wound Imaging (photographs - any number of wounds) 1 5 []  -  Wound Tracing (instead of photographs) 0 X - Simple Wound Measurement - one wound 1 5 []  - Complex Wound Measurement - multiple wounds 0 INTERVENTIONS - Wound Dressings X - Small Wound Dressing one or multiple wounds 1 10 []  - Medium Wound Dressing one or multiple wounds 0 []  - Large Wound Dressing one or multiple wounds 0 []  - Application of Medications - topical 0 []  - Application of Medications - injection 0 INTERVENTIONS - Miscellaneous []  - External ear exam 0 Richard [name], [name] ([id number]) []  - Specimen Collection (cultures, biopsies, blood, body fluids, etc.) 0 []  - Specimen(s) / Culture(s) sent or taken to Lab for analysis 0 []  - Patient Transfer (multiple staff / Harrel Lemon Lift / Similar devices) 0 []  - Simple Staple / Suture removal (25 or less) 0 []  - Complex Staple / Suture removal (26 or more) 0 []  - Hypo / Hyperglycemic Management (close monitor of Blood Glucose) 0 []  - Ankle / Brachial Index (ABI) - do not check if billed separately 0 X - Vital Signs 1 5 Has the patient been seen at the hospital within the last three years: Yes Total Score: 80 Level Of Care: New/Established - Level 3 Electronic Signature(s) Signed: 01/20/2015 5:04:53 PM By: Montey Hora Entered By: Montey Hora on 01/20/2015 08:31:47 Richard [name] ([id number]) -------------------------------------------------------------------------------- Encounter Discharge Information Details Patient Name: Richard Hidden A. Date of Service: 01/20/2015 8:00 AM Medical Record Number: [id number] Patient Account Number: [id number] Date of Birth/Sex: 06-04-1950 (64 y.o. Male) Treating RN: Montey Hora Primary Care Physician: Bluford Kaufmann Other Clinician: Referring Physician: Bluford Kaufmann Treating Physician/Extender: Frann Rider in Treatment: 9 Encounter Discharge Information Items Discharge Pain Level: 0 Discharge Condition: Stable Ambulatory Status: Ambulatory Discharge Destination: Home Transportation: Private Auto Accompanied By: self Schedule Follow-up Appointment: Yes Medication Reconciliation completed and provided to Patient/Care No Sharai [place]: Provided on Clinical Summary of Care: 01/20/2015 Form Type Recipient Paper Patient [facility] Electronic Signature(s) Signed: 01/20/2015 8:44:07 AM By: Ruthine Dose Entered By: Ruthine Dose on 01/20/2015 08:44:07 Richard [name] ([id number]) -------------------------------------------------------------------------------- Lower Extremity Assessment Details Patient Name: Richard Hidden A. Date of Service: 01/20/2015 8:00 AM Medical Record Number: [id number] Patient Account Number: [id number] Date of Birth/Sex: 1950-11-17 (64 y.o. Male) Treating RN: Montey Hora Primary Care Physician: Bluford Kaufmann Other Clinician: Referring Physician: Bluford Kaufmann Treating Physician/Extender: Frann Rider in Treatment: 9 Vascular Assessment Pulses: Posterior Tibial Dorsalis Pedis Palpable: [Right:Yes] Extremity colors, hair growth, and conditions: Extremity Color: [Right:Mottled] Hair Growth on Extremity: [Right:Yes] Temperature of Extremity: [Right:Warm] Capillary Refill: [Right:< 3 seconds] Toe Nail Assessment Left: Right: Thick: No Discolored: No Deformed: No Improper Length and Hygiene: No Electronic Signature(s) Signed: 01/20/2015 5:04:53 PM By: Montey Hora Entered By: Montey Hora on 01/20/2015 08:21:33 Richard [name] ([id number]) -------------------------------------------------------------------------------- Multi Wound Chart Details Patient Name: Richard Hidden A. Date of Service: 01/20/2015 8:00 AM Medical Record Number: [id number] Patient Account Number: [id number] Date of Birth/Sex: 11-17-1950 (64 y.o.  Male) Treating RN: Montey Hora Primary Care Physician: Bluford Kaufmann Other Clinician: Referring Physician: Bluford Kaufmann Treating Physician/Extender: Frann Rider in Treatment: 9 Vital Signs Height(in): 68 Pulse(bpm): 83 Weight(lbs): 165 Blood Pressure 124/70 (mmHg): Body Mass Index(BMI): 25 Temperature(F): 98.1 Respiratory Rate 16 (breaths/min): Photos: [8:No Photos] [N/A:N/A] Wound Location: [8:Right Foot - Lateral] [N/A:N/A] Wounding Event: [8:Not Known] [N/A:N/A] Primary Etiology: [8:To be determined] [N/A:N/A] Comorbid History: [8:Neuropathy] [N/A:N/A] Date Acquired: [8:01/20/2015] [N/A:N/A] Weeks of Treatment: [8:0] [N/A:N/A] Wound Status: [8:Open] [N/A:N/A] Measurements L x W x D 0.6x1.3x0.2 [N/A:N/A] (cm) Area (cm) : [8:0.613] [  N/A:N/A] Volume (cm) : [8:0.123] [N/A:N/A] % Reduction in Area: [8:0.00%] [N/A:N/A] % Reduction in Volume: 0.00% [N/A:N/A] Classification: [8:Full Thickness Without Exposed Support Structures] [N/A:N/A] Exudate Amount: [8:Medium] [N/A:N/A] Exudate Type: [8:Serous] [N/A:N/A] Exudate Color: [8:amber] [N/A:N/A] Wound Margin: [8:Flat and Intact] [N/A:N/A] Granulation Amount: [8:Large (67-100%)] [N/A:N/A] Granulation Quality: [8:Pink] [N/A:N/A] Necrotic Amount: [8:None Present (0%)] [N/A:N/A] Exposed Structures: [8:Fascia: No Fat: No Tendon: No Muscle: No Joint: No Bone: No] [N/A:N/A] Limited to Skin Breakdown Epithelialization: None N/A N/A Periwound Skin Texture: Edema: No N/A N/A Excoriation: No Induration: No Callus: No Crepitus: No Fluctuance: No Friable: No Rash: No Scarring: No Periwound Skin Maceration: No N/A N/A Moisture: Moist: No Dry/Scaly: No Periwound Skin Color: Atrophie Blanche: No N/A N/A Cyanosis: No Ecchymosis: No Erythema: No Hemosiderin Staining: No Mottled: No Pallor: No Rubor: No Temperature: No Abnormality N/A N/A Tenderness on No N/A N/A Palpation: Wound  Preparation: Ulcer Cleansing: N/A N/A Rinsed/Irrigated with Saline Topical Anesthetic Applied: None Treatment Notes Electronic Signature(s) Signed: 01/20/2015 5:04:53 PM By: Montey Hora Entered By: Montey Hora on 01/20/2015 08:23:44 Richard [name] ([id number]) -------------------------------------------------------------------------------- [place] Details Patient Name: Richard Hidden A. Date of Service: 01/20/2015 8:00 AM Medical Record Number: [id number] Patient Account Number: [id number] Date of Birth/Sex: [birth year] (64 y.o. Male) Treating RN: Montey Hora Primary Care Physician: Bluford Kaufmann Other Clinician: Referring Physician: Bluford Kaufmann Treating Physician/Extender: Frann Rider in Treatment: 9 Active Inactive Orientation to the Wound Care Program Nursing Diagnoses: Knowledge deficit related to the wound healing center program Goals: Patient/caregiver will verbalize understanding of the [place] Program Date Initiated: 11/17/2014 Goal Status: Active Interventions: Provide education on orientation to the wound center Notes: Wound/Skin Impairment Nursing Diagnoses: Impaired tissue integrity Knowledge deficit related to ulceration/compromised skin integrity Goals: Patient/caregiver will verbalize understanding of skin care regimen Date Initiated: 11/17/2014 Goal Status: Active Ulcer/skin breakdown will have a volume reduction of 30% by week 4 Date Initiated: 11/17/2014 Goal Status: Active Ulcer/skin breakdown will have a volume reduction of 50% by week 8 Date Initiated: 11/17/2014 Goal Status: Active Ulcer/skin breakdown will have a volume reduction of 80% by week 12 Date Initiated: 11/17/2014 Goal Status: Active Ulcer/skin breakdown will heal within 14 weeks Date Initiated: 11/17/2014 Richard [name], Richard [name] ([id number]) Goal Status: Active Interventions: Assess patient/caregiver ability to obtain necessary  supplies Assess patient/caregiver ability to perform ulcer/skin care regimen upon admission and as needed Assess ulceration(s) every visit Provide education on ulcer and skin care Treatment Activities: Skin care regimen initiated : 01/20/2015 Topical wound management initiated : 01/20/2015 Notes: Electronic Signature(s) Signed: 01/20/2015 5:04:53 PM By: Montey Hora Entered By: Montey Hora on 01/20/2015 08:23:37 Richard [name] ([id number]) -------------------------------------------------------------------------------- Patient/Caregiver Education Details Patient Name: Richard Hidden A. Date of Service: 01/20/2015 8:00 AM Medical Record Number: [id number] Patient Account Number: [id number] Date of Birth/Gender: [birth year] (64 y.o. Male) Treating RN: Montey Hora Primary Care Physician: Bluford Kaufmann Other Clinician: Referring Physician: Bluford Kaufmann Treating Physician/Extender: Frann Rider in Treatment: 9 Education Assessment Education Provided To: Patient Education Topics Provided Venous: Handouts: Other: edema management Methods: Explain/Verbal Responses: State content correctly Electronic Signature(s) Signed: 01/20/2015 8:27:28 AM By: Montey Hora Entered By: Montey Hora on 01/20/2015 81:15:72 Richard [name] ([id number]) -------------------------------------------------------------------------------- Wound Assessment Details Patient Name: Richard Hidden A. Date of Service: 01/20/2015 8:00 AM Medical Record Number: [id number] Patient Account Number: [id number] Date of Birth/Sex: 11/01/1950 (64 y.o. Male) Treating RN: Montey Hora Primary Care Physician: Bluford Kaufmann Other Clinician: Referring Physician: Bluford Kaufmann Treating Physician/Extender: Frann Rider in Treatment: 9  Wound Status Wound Number: 7 Primary Etiology: Trauma, Other Wound Location: Right, Distal Lower Leg Wound Status: Healed -  Epithelialized Wounding Event: Trauma Date Acquired: 01/12/2015 Weeks Of Treatment: 1 Clustered Wound: Yes Photos Photo Uploaded By: Montey Hora on 01/20/2015 10:14:21 Wound Measurements Length: (cm) 0 % Reduction i Width: (cm) 0 % Reduction i Depth: (cm) 0 Area: (cm) 0 Volume: (cm) 0 n Area: 100% n Volume: 100% Wound Description Classification: Partial Thickness Periwound Skin Texture Texture Color No Abnormalities Noted: No No Abnormalities Noted: No Moisture No Abnormalities Noted: No Electronic Signature(s) Signed: 01/20/2015 5:04:53 PM By: Feliciana Rossetti ([id number]) Entered By: Montey Hora on 01/20/2015 08:29:12 Richard [name] ([id number]) -------------------------------------------------------------------------------- Wound Assessment Details Patient Name: Richard Hidden A. Date of Service: 01/20/2015 8:00 AM Medical Record Number: [id number] Patient Account Number: [id number] Date of Birth/Sex: 12/10/1950 (64 y.o. Male) Treating RN: Montey Hora Primary Care Physician: Bluford Kaufmann Other Clinician: Referring Physician: Bluford Kaufmann Treating Physician/Extender: Frann Rider in Treatment: 9 Wound Status Wound Number: 8 Primary Etiology: To be determined Wound Location: Right Foot - Lateral Wound Status: Open Wounding Event: Not Known Comorbid History: Neuropathy Date Acquired: 01/20/2015 Weeks Of Treatment: 0 Clustered Wound: No Photos Photo Uploaded By: Montey Hora on 01/20/2015 10:14:22 Wound Measurements Length: (cm) 0.6 Width: (cm) 1.3 Depth: (cm) 0.2 Area: (cm) 0.613 Volume: (cm) 0.123 % Reduction in Area: 0% % Reduction in Volume: 0% Epithelialization: None Tunneling: No Undermining: No Wound Description Full Thickness Without Exposed Foul Odor Aft Classification: Support Structures Wound Margin: Flat and Intact Exudate Medium Amount: Exudate Type: Serous Exudate Color: amber er  Cleansing: No Wound Bed Granulation Amount: Large (67-100%) Exposed Structure Granulation Quality: Pink Fascia Exposed: No Necrotic Amount: None Present (0%) Fat Layer Exposed: No Richard [name], [name] ([id number]) Tendon Exposed: No Muscle Exposed: No Joint Exposed: No Bone Exposed: No Limited to Skin Breakdown Periwound Skin Texture Texture Color No Abnormalities Noted: No No Abnormalities Noted: No Callus: No Atrophie Blanche: No Crepitus: No Cyanosis: No Excoriation: No Ecchymosis: No Fluctuance: No Erythema: No Friable: No Hemosiderin Staining: No Induration: No Mottled: No Localized Edema: No Pallor: No Rash: No Rubor: No Scarring: No Temperature / Pain Moisture Temperature: No Abnormality No Abnormalities Noted: No Dry / Scaly: No Maceration: No Moist: No Wound Preparation Ulcer Cleansing: Rinsed/Irrigated with Saline Topical Anesthetic Applied: None Treatment Notes Wound #8 (Right, Lateral Foot) 1. Cleansed with: Clean wound with Normal Saline 4. Dressing Applied: Prisma Ag 5. Secondary Dressing Applied Bordered Foam Dressing Electronic Signature(s) Signed: 01/20/2015 5:04:53 PM By: Montey Hora Entered By: Montey Hora on 01/20/2015 08:23:29 Richard [name] ([id number]) -------------------------------------------------------------------------------- [place] Details Patient Name: Richard Hidden A. Date of Service: 01/20/2015 8:00 AM Medical Record Number: [id number] Patient Account Number: [id number] Date of Birth/Sex: 1950/09/19 (64 y.o. Male) Treating RN: Montey Hora Primary Care Physician: Bluford Kaufmann Other Clinician: Referring Physician: Bluford Kaufmann Treating Physician/Extender: Frann Rider in Treatment: 9 Vital Signs Time Taken: 08:15 Temperature (F): 98.1 Height (in): 68 Pulse (bpm): 83 Weight (lbs): 165 Respiratory Rate (breaths/min): 16 Body Mass Index (BMI): 25.1 Blood Pressure (mmHg): 124/70 Reference  Range: 80 - 120 mg / dl Electronic Signature(s) Signed: 01/20/2015 5:04:53 PM By: Montey Hora Entered By: Montey Hora on 01/20/2015 08:17:01

## 2015-01-22 NOTE — Telephone Encounter (Signed)
Derinda Late is going to talk to Dr.K about this.

## 2015-01-26 NOTE — Telephone Encounter (Signed)
Richard [name] did you talk to Dr.K about this pt so I can close this message?

## 2015-01-27 ENCOUNTER — Encounter: Payer: BLUE CROSS/BLUE SHIELD | Admitting: Surgery

## 2015-01-27 DIAGNOSIS — S81811D Laceration without foreign body, right lower leg, subsequent encounter: Secondary | ICD-10-CM | POA: Diagnosis not present

## 2015-01-27 NOTE — Progress Notes (Signed)
SHADRACH, BARTUNEK ([id number]) Visit Report for 01/27/2015 Arrival Information Details Patient Name: Richard [name], Richard [name]. Date of Service: 01/27/2015 2:15 PM Medical Record Number: [id number] Patient Account Number: [id number] Date of Birth/Sex: [birth year] (64 y.o. Male) Treating RN: Baruch Gouty, RN, BSN, Velva Harman Primary Care Physician: Bluford Kaufmann Other Clinician: Referring Physician: Bluford Kaufmann Treating Physician/Extender: Frann Rider in Treatment: 10 Visit Information History Since Last Visit Any new allergies or adverse reactions: No Patient Arrived: Ambulatory Had a fall or experienced change in No Arrival Time: 14:13 activities of daily living that may affect Accompanied By: self risk of falls: Transfer Assistance: None Signs or symptoms of abuse/neglect since last No Patient Identification Verified: Yes visito Secondary Verification Process Yes Hospitalized since last visit: No Completed: Has Dressing in Place as Prescribed: Yes Patient Requires Transmission- No Pain Present Now: No Based Precautions: Patient Has Alerts: Yes Patient Alerts: Patient on Blood Thinner Xarelto Electronic Signature(s) Signed: 01/27/2015 2:15:33 PM By: Regan Lemming BSN, RN Entered By: Regan Lemming on 01/27/2015 14:15:33 [name] ([id number]) -------------------------------------------------------------------------------- Clinic Level of Care Assessment Details Patient Name: Richard Hidden A. Date of Service: 01/27/2015 2:15 PM Medical Record Number: [id number] Patient Account Number: [id number] Date of Birth/Sex: 05/15/1950 (64 y.o. Male) Treating RN: Baruch Gouty, RN, BSN, [place] Primary Care Physician: Bluford Kaufmann Other Clinician: Referring Physician: Bluford Kaufmann Treating Physician/Extender: Frann Rider in Treatment: 10 Clinic Level of Care Assessment Items TOOL 4 Quantity Score []  - Use when only an EandM is performed on FOLLOW-UP visit  0 ASSESSMENTS - Nursing Assessment / Reassessment X - Reassessment of Co-morbidities (includes updates in patient status) 1 10 X - Reassessment of Adherence to Treatment Plan 1 5 ASSESSMENTS - Wound and Skin Assessment / Reassessment []  - Simple Wound Assessment / Reassessment - one wound 0 []  - Complex Wound Assessment / Reassessment - multiple wounds 0 []  - Dermatologic / Skin Assessment (not related to wound area) 0 ASSESSMENTS - Focused Assessment []  - Circumferential Edema Measurements - multi extremities 0 []  - Nutritional Assessment / Counseling / Intervention 0 []  - Lower Extremity Assessment (monofilament, tuning fork, pulses) 0 []  - Peripheral Arterial Disease Assessment (using hand held doppler) 0 ASSESSMENTS - Ostomy and/or Continence Assessment and Care []  - Incontinence Assessment and Management 0 []  - Ostomy Care Assessment and Management (repouching, etc.) 0 PROCESS - Coordination of Care X - Simple Patient / Family Education for ongoing care 1 15 []  - Complex (extensive) Patient / Family Education for ongoing care 0 []  - Staff obtains Programmer, systems, Records, Test Results / Process Orders 0 []  - Staff telephones HHA, Nursing Homes / Clarify orders / etc 0 []  - Routine Transfer to another Facility (non-emergent condition) 0 EUEL, CASTILE A. ([id number]) []  - Routine Hospital Admission (non-emergent condition) 0 []  - New Admissions / Biomedical engineer / Ordering NPWT, Apligraf, etc. 0 []  - Emergency Hospital Admission (emergent condition) 0 []  - Simple Discharge Coordination 0 []  - Complex (extensive) Discharge Coordination 0 PROCESS - Special Needs []  - Pediatric / Minor Patient Management 0 []  - Isolation Patient Management 0 []  - Hearing / Language / Visual special needs 0 []  - Assessment of Community assistance (transportation, D/C planning, etc.) 0 []  - Additional assistance / Altered mentation 0 []  - Support Surface(s) Assessment (bed, cushion, seat, etc.)  0 INTERVENTIONS - Wound Cleansing / Measurement []  - Simple Wound Cleansing - one wound 0 []  - Complex Wound Cleansing - multiple wounds 0 X - Wound Imaging (photographs - any number of wounds)  1 5 []  - Wound Tracing (instead of photographs) 0 []  - Simple Wound Measurement - one wound 0 []  - Complex Wound Measurement - multiple wounds 0 INTERVENTIONS - Wound Dressings []  - Small Wound Dressing one or multiple wounds 0 []  - Medium Wound Dressing one or multiple wounds 0 []  - Large Wound Dressing one or multiple wounds 0 []  - Application of Medications - topical 0 []  - Application of Medications - injection 0 INTERVENTIONS - Miscellaneous []  - External ear exam 0 Paola, [place] A. ([id number]) []  - Specimen Collection (cultures, biopsies, blood, body fluids, etc.) 0 []  - Specimen(s) / Culture(s) sent or taken to Lab for analysis 0 []  - Patient Transfer (multiple staff / Harrel Lemon Lift / Similar devices) 0 []  - Simple Staple / Suture removal (25 or less) 0 []  - Complex Staple / Suture removal (26 or more) 0 []  - Hypo / Hyperglycemic Management (close monitor of Blood Glucose) 0 []  - Ankle / Brachial Index (ABI) - do not check if billed separately 0 X - Vital Signs 1 5 Has the patient been seen at the hospital within the last three years: Yes Total Score: 40 Level Of Care: New/Established - Level 2 Electronic Signature(s) Signed: 01/27/2015 2:34:10 PM By: Regan Lemming BSN, RN Entered By: Regan Lemming on 01/27/2015 14:34:09 [name] ([id number]) -------------------------------------------------------------------------------- Encounter Discharge Information Details Patient Name: Richard Hidden A. Date of Service: 01/27/2015 2:15 PM Medical Record Number: [id number] Patient Account Number: [id number] Date of Birth/Sex: July 21, 1950 (64 y.o. Male) Treating RN: Baruch Gouty, RN, BSN, Velva Harman Primary Care Physician: Bluford Kaufmann Other Clinician: Referring Physician: Bluford Kaufmann Treating Physician/Extender: Frann Rider in Treatment: 10 Encounter Discharge Information Items Discharge Pain Level: 0 Discharge Condition: Stable Ambulatory Status: Ambulatory Discharge Destination: Home Transportation: Private Auto Accompanied By: self Schedule Follow-up Appointment: No Medication Reconciliation completed and provided to Patient/Care No Lacheryl Niesen: Provided on Clinical Summary of Care: 01/27/2015 Form Type Recipient Paper Patient [facility] Electronic Signature(s) Signed: 01/27/2015 2:39:38 PM By: Ruthine Dose Previous Signature: 01/27/2015 2:36:02 PM Version By: Regan Lemming BSN, RN Entered By: Ruthine Dose on 01/27/2015 14:39:38 [name] ([id number]) -------------------------------------------------------------------------------- Lower Extremity Assessment Details Patient Name: Richard Hidden A. Date of Service: 01/27/2015 2:15 PM Medical Record Number: [id number] Patient Account Number: [id number] Date of Birth/Sex: Oct 20, 1950 (64 y.o. Male) Treating RN: Baruch Gouty, RN, BSN, Velva Harman Primary Care Physician: Bluford Kaufmann Other Clinician: Referring Physician: Bluford Kaufmann Treating Physician/Extender: Frann Rider in Treatment: 10 Vascular Assessment Pulses: Posterior Tibial Dorsalis Pedis Palpable: [Right:Yes] Extremity colors, hair growth, and conditions: Extremity Color: [Right:Normal] Hair Growth on Extremity: [Right:Yes] Temperature of Extremity: [Right:Warm] Capillary Refill: [Right:< 3 seconds] Toe Nail Assessment Left: Right: Thick: No Discolored: No Deformed: No Improper Length and Hygiene: No Electronic Signature(s) Signed: 01/27/2015 2:23:37 PM By: Regan Lemming BSN, RN Entered By: Regan Lemming on 01/27/2015 14:23:37 [name] ([id number]) -------------------------------------------------------------------------------- [place] Details Patient Name: Richard Hidden A. Date of  Service: 01/27/2015 2:15 PM Medical Record Number: [id number] Patient Account Number: [id number] Date of Birth/Sex: 05-10-1950 (64 y.o. Male) Treating RN: Baruch Gouty, RN, BSN, Velva Harman Primary Care Physician: Bluford Kaufmann Other Clinician: Referring Physician: Bluford Kaufmann Treating Physician/Extender: Frann Rider in Treatment: 10 Active Inactive Electronic Signature(s) Signed: 01/27/2015 2:28:12 PM By: Regan Lemming BSN, RN Entered By: Regan Lemming on 01/27/2015 14:28:11 [name] ([id number]) -------------------------------------------------------------------------------- Pain Assessment Details Patient Name: Richard Hidden A. Date of Service: 01/27/2015 2:15 PM Medical Record Number: [id number] Patient Account Number: [id number] Date of Birth/Sex: 07-07-1950 (  64 y.o. Male) Treating RN: [place], RN, BSN, Velva Harman Primary Care Physician: Bluford Kaufmann Other Clinician: Referring Physician: Bluford Kaufmann Treating Physician/Extender: Frann Rider in Treatment: 10 Active Problems Location of Pain Severity and Description of Pain Patient Has Paino No Site Locations Pain Management and Medication Current Pain Management: Electronic Signature(s) Signed: 01/27/2015 2:15:41 PM By: Regan Lemming BSN, RN Entered By: Regan Lemming on 01/27/2015 14:15:41 [name] ([id number]) -------------------------------------------------------------------------------- Patient/Caregiver Education Details Patient Name: Richard Hidden A. Date of Service: 01/27/2015 2:15 PM Medical Record Number: [id number] Patient Account Number: [id number] Date of Birth/Gender: Jun 24, 1950 (64 y.o. Male) Treating RN: Baruch Gouty, RN, BSN, Velva Harman Primary Care Physician: Bluford Kaufmann Other Clinician: Referring Physician: Bluford Kaufmann Treating Physician/Extender: Frann Rider in Treatment: 10 Education Assessment Education Provided To: Patient Education Topics  Provided Electronic Signature(s) Signed: 01/27/2015 2:36:09 PM By: Regan Lemming BSN, RN Entered By: Regan Lemming on 01/27/2015 14:36:09 [name] ([id number]) -------------------------------------------------------------------------------- Wound Assessment Details Patient Name: Richard Hidden A. Date of Service: 01/27/2015 2:15 PM Medical Record Number: [id number] Patient Account Number: [id number] Date of Birth/Sex: 20-Sep-1950 (64 y.o. Male) Treating RN: Baruch Gouty, RN, BSN, [place] Primary Care Physician: Bluford Kaufmann Other Clinician: Referring Physician: Bluford Kaufmann Treating Physician/Extender: Frann Rider in Treatment: 10 Wound Status Wound Number: [id number] Primary Etiology: Trauma, Other Wound Location: Right Foot - Lateral Wound Status: Healed - Epithelialized Wounding Event: Not Known Comorbid History: Neuropathy Date Acquired: 01/20/2015 Weeks Of Treatment: 1 Clustered Wound: No Photos Photo Uploaded By: Regan Lemming on 01/27/2015 16:47:39 Wound Measurements Length: (cm) 0 % Reduction i Width: (cm) 0 % Reduction i Depth: (cm) 0 Epithelializa Area: (cm) 0 Tunneling: Volume: (cm) 0 Undermining: n Area: 100% n Volume: 100% tion: Large (67-100%) No No Wound Description Full Thickness Without Exposed Foul Odor Af Classification: Support Structures Wound Margin: Distinct, outline attached Exudate None Present Amount: ter Cleansing: No Wound Bed Granulation Amount: None Present (0%) Exposed Structure Necrotic Amount: None Present (0%) Fascia Exposed: No Fat Layer Exposed: No Tendon Exposed: No Muscle Exposed: No [place], [place] A. ([id number]) Joint Exposed: No Bone Exposed: No Limited to Skin Breakdown Periwound Skin Texture Texture Color No Abnormalities Noted: No No Abnormalities Noted: No Callus: No Atrophie Blanche: No Crepitus: No Cyanosis: No Excoriation: No Ecchymosis: No Fluctuance: No Erythema: No Friable: No Hemosiderin  Staining: No Induration: No Mottled: No Localized Edema: No Pallor: No Rash: No Rubor: No Scarring: No Temperature / Pain Moisture Temperature: No Abnormality No Abnormalities Noted: No Dry / Scaly: Yes Maceration: No Moist: No Wound Preparation Ulcer Cleansing: Rinsed/Irrigated with Saline Topical Anesthetic Applied: None Electronic Signature(s) Signed: 01/27/2015 2:27:37 PM By: Regan Lemming BSN, RN Entered By: Regan Lemming on 01/27/2015 14:27:36 [name] ([id number]) -------------------------------------------------------------------------------- Vitals Details Patient Name: Richard Hidden A. Date of Service: 01/27/2015 2:15 PM Medical Record Number: [id number] Patient Account Number: [id number] Date of Birth/Sex: 1951-02-07 (64 y.o. Male) Treating RN: Baruch Gouty, RN, BSN, [place] Primary Care Physician: Bluford Kaufmann Other Clinician: Referring Physician: Bluford Kaufmann Treating Physician/Extender: Frann Rider in Treatment: 10 Vital Signs Time Taken: 14:15 Temperature (F): 98.5 Height (in): 68 Pulse (bpm): 72 Weight (lbs): 165 Respiratory Rate (breaths/min): 17 Body Mass Index (BMI): 25.1 Blood Pressure (mmHg): 124/90 Reference Range: 80 - 120 mg / dl Electronic Signature(s) Signed: 01/27/2015 2:16:15 PM By: Regan Lemming BSN, RN Entered By: Regan Lemming on 01/27/2015 14:16:15

## 2015-01-27 NOTE — Telephone Encounter (Signed)
lmovm for pt to return my call 
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
## 2015-02-09 ENCOUNTER — Ambulatory Visit (INDEPENDENT_AMBULATORY_CARE_PROVIDER_SITE_OTHER): Payer: BLUE CROSS/BLUE SHIELD | Admitting: Internal Medicine

## 2015-02-09 ENCOUNTER — Encounter: Payer: Self-pay | Admitting: Internal Medicine

## 2015-02-09 DIAGNOSIS — Z0000 Encounter for general adult medical examination without abnormal findings: Secondary | ICD-10-CM

## 2015-02-09 DIAGNOSIS — D62 Acute posthemorrhagic anemia: Secondary | ICD-10-CM | POA: Diagnosis not present

## 2015-02-09 LAB — CBC WITH DIFFERENTIAL/PLATELET
Basophils Absolute: 0.1 10*3/uL (ref 0.0–0.1)
Basophils Relative: 0.8 % (ref 0.0–3.0)
Eosinophils Absolute: 0.4 10*3/uL (ref 0.0–0.7)
Eosinophils Relative: 5.8 % — ABNORMAL HIGH (ref 0.0–5.0)
HCT: 44.3 % (ref 39.0–52.0)
Hemoglobin: 14.6 g/dL (ref 13.0–17.0)
Lymphocytes Relative: 22.9 % (ref 12.0–46.0)
Lymphs Abs: 1.7 10*3/uL (ref 0.7–4.0)
MCHC: 33 g/dL (ref 30.0–36.0)
MCV: 85 fl (ref 78.0–100.0)
Monocytes Absolute: 0.8 10*3/uL (ref 0.1–1.0)
Monocytes Relative: 10.6 % (ref 3.0–12.0)
Neutro Abs: 4.3 10*3/uL (ref 1.4–7.7)
Neutrophils Relative %: 59.9 % (ref 43.0–77.0)
Platelets: 315 10*3/uL (ref 150.0–400.0)
RBC: 5.21 Mil/uL (ref 4.22–5.81)
RDW: 17 % — ABNORMAL HIGH (ref 11.5–15.5)
WBC: 7.2 10*3/uL (ref 4.0–10.5)

## 2015-02-09 NOTE — Progress Notes (Signed)
Pre visit review using our clinic review tool, if applicable. No additional management support is needed unless otherwise documented below in the visit note. 
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
## 2015-02-09 NOTE — Patient Instructions (Addendum)
Orthopedic follow-up as scheduled  Physical therapy as scheduled  Call or return to clinic prn if these symptoms worsen or fail to improve as anticipated.

## 2015-03-18 ENCOUNTER — Encounter: Payer: Self-pay | Admitting: Internal Medicine

## 2015-03-18 ENCOUNTER — Ambulatory Visit (INDEPENDENT_AMBULATORY_CARE_PROVIDER_SITE_OTHER): Payer: BLUE CROSS/BLUE SHIELD | Admitting: Internal Medicine

## 2015-03-18 VITALS — BP 110/70 | HR 89 | Temp 97.8°F | Resp 20 | Ht 68.0 in | Wt 177.0 lb

## 2015-03-18 DIAGNOSIS — L723 Sebaceous cyst: Secondary | ICD-10-CM | POA: Diagnosis not present

## 2015-03-18 MED ORDER — AMOXICILLIN-POT CLAVULANATE 875-125 MG PO TABS: 1.0000 | ORAL_TABLET | Freq: Two times a day (BID) | ORAL | Status: DC

## 2015-03-18 NOTE — Progress Notes (Signed)
Subjective:    Patient ID: Richard [name], male    DOB: Feb 08, 1951, 64 y.o.   MRN: [id number]  HPI  64 year old patient who is seen today for evaluation of a nodule involving the right axilla.  He first noticed this about 1 week ago when he was putting on deodorant.  He feels that it has become slightly smaller.  He has noted minimal tenderness only without drainage  No past medical history on file.  Social History   Social History  . Marital Status: Unknown    Spouse Name: N/A  . Number of Children: N/A  . Years of Education: N/A   Occupational History  . Not on file.   Social History Main Topics  . Smoking status: Former Smoker    Types: Cigarettes  . Smokeless tobacco: Not on file  . Alcohol Use: Yes  . Drug Use: Not on file  . Sexual Activity: Not on file   Other Topics Concern  . Not on file   Social History Narrative    Past Surgical History  Procedure Laterality Date  . External fixation leg N/A 10/24/2014    Procedure: EXTERNAL FIXATION LEG;  Surgeon: Renette Butters, MD;  Location: [place];  Service: Orthopedics;  Laterality: N/A;  . I&d extremity Right 10/24/2014    Procedure: IRRIGATION AND DEBRIDEMENT EXTREMITY;  Surgeon: Renette Butters, MD;  Location: [place];  Service: Orthopedics;  Laterality: Right;  . Orif ankle fracture Right 10/28/2014    Procedure: OPEN REDUCTION INTERNAL FIXATION (ORIF) PILON;  Surgeon: Renette Butters, MD;  Location: [place];  Service: Orthopedics;  Laterality: Right;  . Orif clavicular fracture Right 10/28/2014    Procedure: OPEN REDUCTION INTERNAL FIXATION (ORIF) CLAVICULAR FRACTURE;  Surgeon: Renette Butters, MD;  Location: [place];  Service: Orthopedics;  Laterality: Right;    No family history on file.  No Known Allergies  No current outpatient prescriptions on file prior to visit.   No current facility-administered medications on file prior to visit.    BP 110/70 mmHg  Pulse 89  Temp(Src) 97.8 F (36.6 C) (Oral)  Resp  20  Ht 5\' 8"  (1.727 m)  Wt 177 lb (80.287 kg)  BMI 26.92 kg/m2  SpO2 96%     Review of Systems  Constitutional: Negative for fever, chills, appetite change and fatigue.  HENT: Negative for congestion, dental problem, ear pain, hearing loss, sore throat, tinnitus, trouble swallowing and voice change.   Eyes: Negative for pain, discharge and visual disturbance.  Respiratory: Negative for cough, chest tightness, wheezing and stridor.   Cardiovascular: Negative for chest pain, palpitations and leg swelling.  Gastrointestinal: Negative for nausea, vomiting, abdominal pain, diarrhea, constipation, blood in stool and abdominal distention.  Genitourinary: Negative for urgency, hematuria, flank pain, discharge, difficulty urinating and genital sores.  Musculoskeletal: Negative for myalgias, back pain, joint swelling, arthralgias, gait problem and neck stiffness.  Skin: Positive for wound. Negative for rash.  Neurological: Negative for dizziness, syncope, speech difficulty, weakness, numbness and headaches.  Hematological: Negative for adenopathy. Does not bruise/bleed easily.  Psychiatric/Behavioral: Negative for behavioral problems and dysphoric mood. The patient is not nervous/anxious.        Objective:   Physical Exam  Skin:  Slightly tender erythematous nodule in the mid right axilla area.  2 mm centrally located, crusted lesion without obvious drainage.           Assessment & Plan:   Sebaceous cyst, right axilla.  Will treat with warm  compresses and oral antibiotic therapy.  Will call if unimproved

## 2015-03-18 NOTE — Progress Notes (Signed)
Pre visit review using our clinic review tool, if applicable. No additional management support is needed unless otherwise documented below in the visit note. 

## 2015-03-18 NOTE — Patient Instructions (Signed)
Epidermal Cyst An epidermal cyst is sometimes called a sebaceous cyst, epidermal inclusion cyst, or infundibular cyst. These cysts usually contain a substance that looks "pasty" or "cheesy" and may have a bad smell. This substance is a protein called keratin. Epidermal cysts are usually found on the face, neck, or trunk. They may also occur in the vaginal area or other parts of the genitalia of both men and women. Epidermal cysts are usually small, painless, slow-growing bumps or lumps that move freely under the skin. It is important not to try to pop them. This may cause an infection and lead to tenderness and swelling. CAUSES  Epidermal cysts may be caused by a deep penetrating injury to the skin or a plugged hair follicle, often associated with acne. SYMPTOMS  Epidermal cysts can become inflamed and cause:  Redness.  Tenderness.  Increased temperature of the skin over the bumps or lumps.  Grayish-white, bad smelling material that drains from the bump or lump. DIAGNOSIS  Epidermal cysts are easily diagnosed by your caregiver during an exam. Rarely, a tissue sample (biopsy) may be taken to rule out other conditions that may resemble epidermal cysts. TREATMENT   Epidermal cysts often get better and disappear on their own. They are rarely ever cancerous.  If a cyst becomes infected, it may become inflamed and tender. This may require opening and draining the cyst. Treatment with antibiotics may be necessary. When the infection is gone, the cyst may be removed with minor surgery.  Small, inflamed cysts can often be treated with antibiotics or by injecting steroid medicines.  Sometimes, epidermal cysts become large and bothersome. If this happens, surgical removal in your caregiver's office may be necessary. HOME CARE INSTRUCTIONS  Only take over-the-counter or prescription medicines as directed by your caregiver.  Take your antibiotics as directed. Finish them even if you start to feel  better. SEEK MEDICAL CARE IF:   Your cyst becomes tender, red, or swollen.  Your condition is not improving or is getting worse.  You have any other questions or concerns. MAKE SURE YOU:  Understand these instructions.  Will watch your condition.  Will get help right away if you are not doing well or get worse.   This information is not intended to replace advice given to you by your health care provider. Make sure you discuss any questions you have with your health care provider.   Document Released: 03/05/2004 Document Revised: 06/27/2011 Document Reviewed: 10/11/2010 Elsevier Interactive Patient Education 2016 Elsevier Inc.  

## 2015-05-06 ENCOUNTER — Telehealth: Payer: Self-pay | Admitting: Internal Medicine

## 2015-05-06 NOTE — Telephone Encounter (Signed)
Pt is having cold sores on mouth and would like new rx generic valtrex 1 gm. [facility] on [address]

## 2015-05-07 MED ORDER — VALACYCLOVIR HCL 1 G PO TABS: ORAL_TABLET | ORAL | Status: DC

## 2015-05-07 NOTE — Telephone Encounter (Signed)
Pt notified Rx sent to pharmacy

## 2015-05-07 NOTE — Telephone Encounter (Signed)
Okay to send Rx for Valtrex for cold sores?

## 2015-05-07 NOTE — Telephone Encounter (Signed)
ok 

## 2015-12-23 ENCOUNTER — Telehealth: Payer: Self-pay | Admitting: Internal Medicine

## 2015-12-23 MED ORDER — GABAPENTIN 300 MG PO CAPS: 300.0000 mg | ORAL_CAPSULE | Freq: Three times a day (TID) | ORAL | 0 refills | Status: DC

## 2015-12-23 NOTE — Telephone Encounter (Signed)
° °  Pt said he is no longer seeing the orthopedic doctor that RX the below medicine . He called today to ask if Dr Raliegh Ip would RX   Pt aware Dr Raliegh Ip will be back in office on Friday     gabapentin (NEURONTIN) 300 MG capsule   Pharmacy ; [facility] [address]

## 2015-12-23 NOTE — Telephone Encounter (Signed)
Discussed medication with Dr. Raliegh Ip and he said okay to give pt one month supply only needs to be seen.  Called pt told him Rx was sent to pharmacy but only a month supply needs to schedule physical per Dr.K. Pt verbalized understanding.

## 2016-02-29 ENCOUNTER — Telehealth: Payer: Self-pay | Admitting: Internal Medicine

## 2016-02-29 NOTE — Telephone Encounter (Signed)
Pt has made cpe for 11/27 at 2:45 pm.  But wants to know if we can send in 30 day gabapentin (NEURONTIN) 300 MG capsule  [facility]/ [address]

## 2016-03-01 MED ORDER — GABAPENTIN 300 MG PO CAPS: 300.0000 mg | ORAL_CAPSULE | Freq: Three times a day (TID) | ORAL | 0 refills | Status: DC

## 2016-03-01 NOTE — Telephone Encounter (Signed)
Left message on voicemail Rx sent to pharmacy as requested. 

## 2016-03-14 ENCOUNTER — Ambulatory Visit (INDEPENDENT_AMBULATORY_CARE_PROVIDER_SITE_OTHER): Payer: BLUE CROSS/BLUE SHIELD | Admitting: Internal Medicine

## 2016-03-14 ENCOUNTER — Encounter: Payer: Self-pay | Admitting: Internal Medicine

## 2016-03-14 VITALS — BP 130/90 | HR 72 | Temp 97.9°F | Resp 20 | Ht 68.0 in | Wt 165.0 lb

## 2016-03-14 DIAGNOSIS — Z23 Encounter for immunization: Secondary | ICD-10-CM

## 2016-03-14 DIAGNOSIS — Z0000 Encounter for general adult medical examination without abnormal findings: Secondary | ICD-10-CM | POA: Diagnosis not present

## 2016-03-14 MED ORDER — VALACYCLOVIR HCL 1 G PO TABS: ORAL_TABLET | ORAL | 4 refills | Status: DC

## 2016-03-14 NOTE — Patient Instructions (Addendum)
Schedule your colonoscopy to help detect colon cancer.    It is important that you exercise regularly, at least 20 minutes 3 to 4 times per week.  If you develop chest pain or shortness of breath seek  medical attention.  Health Maintenance, Male A healthy lifestyle and preventative care can promote health and wellness.  Maintain regular health, dental, and eye exams.  Eat a healthy diet. Foods like vegetables, fruits, whole grains, low-fat dairy products, and lean protein foods contain the nutrients you need and are low in calories. Decrease your intake of foods high in solid fats, added sugars, and salt. Get information about a proper diet from your health care provider, if necessary.  Regular physical exercise is one of the most important things you can do for your health. Most adults should get at least 150 minutes of moderate-intensity exercise (any activity that increases your heart rate and causes you to sweat) each week. In addition, most adults need muscle-strengthening exercises on 2 or more days a week.   Maintain a healthy weight. The body mass index (BMI) is a screening tool to identify possible weight problems. It provides an estimate of body fat based on height and weight. Your health care provider can find your BMI and can help you achieve or maintain a healthy weight. For males 20 years and older:  A BMI below 18.5 is considered underweight.  A BMI of 18.5 to 24.9 is normal.  A BMI of 25 to 29.9 is considered overweight.  A BMI of 30 and above is considered obese.  Maintain normal blood lipids and cholesterol by exercising and minimizing your intake of saturated fat. Eat a balanced diet with plenty of fruits and vegetables. Blood tests for lipids and cholesterol should begin at age 43 and be repeated every 5 years. If your lipid or cholesterol levels are high, you are over age 35, or you are at high risk for heart disease, you may need your cholesterol levels checked more  frequently.Ongoing high lipid and cholesterol levels should be treated with medicines if diet and exercise are not working.  If you smoke, find out from your health care provider how to quit. If you do not use tobacco, do not start.  Lung cancer screening is recommended for adults aged 63-80 years who are at high risk for developing lung cancer because of a history of smoking. A yearly low-dose CT scan of the lungs is recommended for people who have at least a 30-pack-year history of smoking and are current smokers or have quit within the past 15 years. A pack year of smoking is smoking an average of 1 pack of cigarettes a day for 1 year (for example, a 30-pack-year history of smoking could mean smoking 1 pack a day for 30 years or 2 packs a day for 15 years). Yearly screening should continue until the smoker has stopped smoking for at least 15 years. Yearly screening should be stopped for people who develop a health problem that would prevent them from having lung cancer treatment.  If you choose to drink alcohol, do not have more than 2 drinks per day. One drink is considered to be 12 oz (360 mL) of beer, 5 oz (150 mL) of wine, or 1.5 oz (45 mL) of liquor.  Avoid the use of street drugs. Do not share needles with anyone. Ask for help if you need support or instructions about stopping the use of drugs.  High blood pressure causes heart disease and increases  the risk of stroke. High blood pressure is more likely to develop in:  People who have blood pressure in the end of the normal range (100-139/85-89 mm Hg).  People who are overweight or obese.  People who are African American.  If you are 81-47 years of age, have your blood pressure checked every 3-5 years. If you are 58 years of age or older, have your blood pressure checked every year. You should have your blood pressure measured twice-once when you are at a hospital or clinic, and once when you are not at a hospital or clinic. Record the  average of the two measurements. To check your blood pressure when you are not at a hospital or clinic, you can use:  An automated blood pressure machine at a pharmacy.  A home blood pressure monitor.  If you are 53-66 years old, ask your health care provider if you should take aspirin to prevent heart disease.  Diabetes screening involves taking a blood sample to check your fasting blood sugar level. This should be done once every 3 years after age 52 if you are at a normal weight and without risk factors for diabetes. Testing should be considered at a younger age or be carried out more frequently if you are overweight and have at least 1 risk factor for diabetes.  Colorectal cancer can be detected and often prevented. Most routine colorectal cancer screening begins at the age of 44 and continues through age 59. However, your health care provider may recommend screening at an earlier age if you have risk factors for colon cancer. On a yearly basis, your health care provider may provide home test kits to check for hidden blood in the stool. A small camera at the end of a tube may be used to directly examine the colon (sigmoidoscopy or colonoscopy) to detect the earliest forms of colorectal cancer. Talk to your health care provider about this at age 83 when routine screening begins. A direct exam of the colon should be repeated every 5-10 years through age 64, unless early forms of precancerous polyps or small growths are found.  People who are at an increased risk for hepatitis B should be screened for this virus. You are considered at high risk for hepatitis B if:  You were born in a country where hepatitis B occurs often. Talk with your health care provider about which countries are considered high risk.  Your parents were born in a high-risk country and you have not received a shot to protect against hepatitis B (hepatitis B vaccine).  You have HIV or AIDS.  You use needles to inject street  drugs.  You live with, or have sex with, someone who has hepatitis B.  You are a man who has sex with other men (MSM).  You get hemodialysis treatment.  You take certain medicines for conditions like cancer, organ transplantation, and autoimmune conditions.  Hepatitis C blood testing is recommended for all people born from [birth year] through 1963 and any individual with known risk factors for hepatitis C.  Healthy men should no longer receive prostate-specific antigen (PSA) blood tests as part of routine cancer screening. Talk to your health care provider about prostate cancer screening.  Testicular cancer screening is not recommended for adolescents or adult males who have no symptoms. Screening includes self-exam, a health care provider exam, and other screening tests. Consult with your health care provider about any symptoms you have or any concerns you have about testicular cancer.  Practice  safe sex. Use condoms and avoid high-risk sexual practices to reduce the spread of sexually transmitted infections (STIs).  You should be screened for STIs, including gonorrhea and chlamydia if:  You are sexually active and are younger than 24 years.  You are older than 24 years, and your health care provider tells you that you are at risk for this type of infection.  Your sexual activity has changed since you were last screened, and you are at an increased risk for chlamydia or gonorrhea. Ask your health care provider if you are at risk.  If you are at risk of being infected with HIV, it is recommended that you take a prescription medicine daily to prevent HIV infection. This is called pre-exposure prophylaxis (PrEP). You are considered at risk if:  You are a man who has sex with other men (MSM).  You are a heterosexual man who is sexually active with multiple partners.  You take drugs by injection.  You are sexually active with a partner who has HIV.  Talk with your health care provider about  whether you are at high risk of being infected with HIV. If you choose to begin PrEP, you should first be tested for HIV. You should then be tested every 3 months for as long as you are taking PrEP.  Use sunscreen. Apply sunscreen liberally and repeatedly throughout the day. You should seek shade when your shadow is shorter than you. Protect yourself by wearing long sleeves, pants, a wide-brimmed hat, and sunglasses year round whenever you are outdoors.  Tell your health care provider of new moles or changes in moles, especially if there is a change in shape or color. Also, tell your health care provider if a mole is larger than the size of a pencil eraser.  A one-time screening for abdominal aortic aneurysm (AAA) and surgical repair of large AAAs by ultrasound is recommended for men aged 34-75 years who are current or former smokers.  Stay current with your vaccines (immunizations). This information is not intended to replace advice given to you by your health care provider. Make sure you discuss any questions you have with your health care provider. Document Released: 10/01/2007 Document Revised: 04/25/2014 Document Reviewed: 01/06/2015 Elsevier Interactive Patient Education  2017 [employer].

## 2016-03-14 NOTE — Progress Notes (Signed)
Pre visit review using our clinic review tool, if applicable. No additional management support is needed unless otherwise documented below in the visit note. 
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
## 2016-03-16 ENCOUNTER — Encounter: Payer: Self-pay | Admitting: Gastroenterology

## 2016-05-25 ENCOUNTER — Telehealth: Payer: Self-pay

## 2016-05-25 NOTE — Telephone Encounter (Signed)
Yes 2 day prep as you outlined

## 2016-05-25 NOTE — Telephone Encounter (Signed)
Dr Fuller Plan,      2003 report reads, "Fair, adequate prep" with Golytely.  Would you like to go ahead with a standard 2 day prep with Suprep and Miralax?                                                                                                            Thank you, Angela/PV

## 2016-05-30 ENCOUNTER — Ambulatory Visit (AMBULATORY_SURGERY_CENTER): Payer: Self-pay

## 2016-05-30 VITALS — Ht 68.0 in | Wt 165.0 lb

## 2016-05-30 DIAGNOSIS — Z860100 Personal history of colon polyps, unspecified: Secondary | ICD-10-CM

## 2016-05-30 MED ORDER — SUPREP BOWEL PREP KIT 17.5-3.13-1.6 GM/177ML PO SOLN: 1.0000 | Freq: Once | ORAL | 0 refills | Status: AC

## 2016-05-30 NOTE — Progress Notes (Signed)
No allergies to eggs or soy No past problems with aneshesia except took several hours to get over conscious sedation  No home oxygen No diet meds  Declined emmi

## 2016-05-31 ENCOUNTER — Encounter: Payer: Self-pay | Admitting: Gastroenterology

## 2016-05-31 ENCOUNTER — Telehealth: Payer: Self-pay | Admitting: Gastroenterology

## 2016-05-31 NOTE — Telephone Encounter (Signed)
Left a message for patient to return my call. 

## 2016-05-31 NOTE — Telephone Encounter (Signed)
Informed patient we do not do PA's for Suprep normally because they are not covered. Informed patient we will put him on a list for a free Suprep sample and will call him when it gets closer to his procedure date. Patient verbalized understanding.

## 2016-06-10 ENCOUNTER — Telehealth: Payer: Self-pay | Admitting: Gastroenterology

## 2016-06-10 NOTE — Telephone Encounter (Signed)
Left a message for patient that we left the sample of the prep up front for patient to pick up.

## 2016-06-13 ENCOUNTER — Ambulatory Visit (AMBULATORY_SURGERY_CENTER): Payer: BLUE CROSS/BLUE SHIELD | Admitting: Gastroenterology

## 2016-06-13 ENCOUNTER — Encounter: Payer: Self-pay | Admitting: Gastroenterology

## 2016-06-13 VITALS — BP 101/59 | HR 65 | Temp 98.4°F | Resp 12 | Ht 68.0 in | Wt 165.0 lb

## 2016-06-13 DIAGNOSIS — K635 Polyp of colon: Secondary | ICD-10-CM | POA: Diagnosis not present

## 2016-06-13 DIAGNOSIS — D125 Benign neoplasm of sigmoid colon: Secondary | ICD-10-CM

## 2016-06-13 DIAGNOSIS — D128 Benign neoplasm of rectum: Secondary | ICD-10-CM

## 2016-06-13 DIAGNOSIS — Z86010 Personal history of colonic polyps: Secondary | ICD-10-CM | POA: Diagnosis not present

## 2016-06-13 DIAGNOSIS — D126 Benign neoplasm of colon, unspecified: Secondary | ICD-10-CM | POA: Diagnosis not present

## 2016-06-13 MED ORDER — SODIUM CHLORIDE 0.9 % IV SOLN: 500.0000 mL | INTRAVENOUS | Status: DC

## 2016-06-13 NOTE — Progress Notes (Signed)
Report to PACU, RN, vss, BBS= Clear.  

## 2016-06-13 NOTE — Op Note (Signed)
[facility] Patient Name: Richard [name] Procedure Date: 06/13/2016 1:36 PM MRN: [id number] Endoscopist: Ladene Artist , MD Age: 66 Referring MD:  Date of Birth: December 16, 1950 Gender: Male Account #: [id number] Procedure:                Colonoscopy Indications:              Surveillance: Personal history of adenomatous                            polyps on last colonoscopy > 5 years ago Medicines:                Monitored Anesthesia Care Procedure:                Pre-Anesthesia Assessment:                           - Prior to the procedure, a History and Physical                            was performed, and patient medications and                            allergies were reviewed. The patient's tolerance of                            previous anesthesia was also reviewed. The risks                            and benefits of the procedure and the sedation                            options and risks were discussed with the patient.                            All questions were answered, and informed consent                            was obtained. Prior Anticoagulants: The patient has                            taken no previous anticoagulant or antiplatelet                            agents. ASA Grade Assessment: II - A patient with                            mild systemic disease. After reviewing the risks                            and benefits, the patient was deemed in                            satisfactory condition to undergo the procedure.  After obtaining informed consent, the colonoscope                            was passed under direct vision. Throughout the                            procedure, the patient's blood pressure, pulse, and                            oxygen saturations were monitored continuously. The                            Model PCF-[id number]) scope was introduced                            through the anus and  advanced to the the cecum,                            identified by appendiceal orifice and ileocecal                            valve. The ileocecal valve, appendiceal orifice,                            and rectum were photographed. The quality of the                            bowel preparation was good. The colonoscopy was                            performed without difficulty. The patient tolerated                            the procedure well. Scope In: 1:50:26 PM Scope Out: 2:05:51 PM Scope Withdrawal Time: 0 hours 12 minutes 59 seconds  Total Procedure Duration: 0 hours 15 minutes 25 seconds  Findings:                 The perianal and digital rectal examinations were                            normal.                           Three sessile polyps were found in the rectum (1)                            and sigmoid colon (2). The polyps were 5 to 6 mm in                            size. These polyps were removed with a cold snare.                            Resection and retrieval were complete.  Multiple small-mouthed diverticula were found in                            the sigmoid colon.                           The exam was otherwise without abnormality on                            direct and retroflexion views. Complications:            No immediate complications. Estimated blood loss:                            None. Estimated Blood Loss:     Estimated blood loss: none. Impression:               - Three 5 to 6 mm polyps in the rectum and in the                            sigmoid colon, removed with a cold snare. Resected                            and retrieved.                           - Diverticulosis in the sigmoid colon.                           - The examination was otherwise normal on direct                            and retroflexion views. Recommendation:           - Repeat colonoscopy in 5 years for surveillance.                            - Patient has a contact number available for                            emergencies. The signs and symptoms of potential                            delayed complications were discussed with the                            patient. Return to normal activities tomorrow.                            Written discharge instructions were provided to the                            patient.                           - High fiber diet.                           -  Continue present medications.                           - Await pathology results. Ladene Artist, MD 06/13/2016 2:12:13 PM This report has been signed electronically.

## 2016-06-13 NOTE — Progress Notes (Signed)
Called to room to assist during endoscopic procedure.  Patient ID and intended procedure confirmed with present staff. Received instructions for my participation in the procedure from the performing physician.  

## 2016-06-13 NOTE — Patient Instructions (Signed)
Impression/Recommendations:  Polyp handout given to patient. Diverticulosis handout given to patient. High fiber diet handout given to patient.  Repeat colonoscopy in 5 years for surveillance.  YOU HAD AN ENDOSCOPIC PROCEDURE TODAY AT [facility]:   Refer to the procedure report that was given to you for any specific questions about what was found during the examination.  If the procedure report does not answer your questions, please call your gastroenterologist to clarify.  If you requested that your care partner not be given the details of your procedure findings, then the procedure report has been included in a sealed envelope for you to review at your convenience later.  YOU SHOULD EXPECT: Some feelings of bloating in the abdomen. Passage of more gas than usual.  Walking can help get rid of the air that was put into your GI tract during the procedure and reduce the bloating. If you had a lower endoscopy (such as a colonoscopy or flexible sigmoidoscopy) you may notice spotting of blood in your stool or on the toilet paper. If you underwent a bowel prep for your procedure, you may not have a normal bowel movement for a few days.  Please Note:  You might notice some irritation and congestion in your nose or some drainage.  This is from the oxygen used during your procedure.  There is no need for concern and it should clear up in a day or so.  SYMPTOMS TO REPORT IMMEDIATELY:   Following lower endoscopy (colonoscopy or flexible sigmoidoscopy):  Excessive amounts of blood in the stool  Significant tenderness or worsening of abdominal pains  Swelling of the abdomen that is new, acute  Fever of 100F or higher For urgent or emergent issues, a gastroenterologist can be reached at any hour by calling [phone].   DIET:  We do recommend a small meal at first, but then you may proceed to your regular diet.  Drink plenty of fluids but you should avoid alcoholic beverages for 24  hours.  ACTIVITY:  You should plan to take it easy for the rest of today and you should NOT DRIVE or use heavy machinery until tomorrow (because of the sedation medicines used during the test).    FOLLOW UP: Our staff will call the number listed on your records the next business day following your procedure to check on you and address any questions or concerns that you may have regarding the information given to you following your procedure. If we do not reach you, we will leave a message.  However, if you are feeling well and you are not experiencing any problems, there is no need to return our call.  We will assume that you have returned to your regular daily activities without incident.  If any biopsies were taken you will be contacted by phone or by letter within the next 1-3 weeks.  Please call us at [phone] if you have not heard about the biopsies in 3 weeks.    SIGNATURES/CONFIDENTIALITY: You and/or your care partner have signed paperwork which will be entered into your electronic medical record.  These signatures attest to the fact that that the information above on your After Visit Summary has been reviewed and is understood.  Full responsibility of the confidentiality of this discharge information lies with you and/or your care-partner.

## 2016-06-13 NOTE — Progress Notes (Signed)
Informed J Monday CRNA of pt saying he had a hard time waking up after last colonoscopy. Unable to find any documentation from recovery rm on that procedure,only proc report.

## 2016-06-14 ENCOUNTER — Telehealth: Payer: Self-pay

## 2016-06-14 NOTE — Telephone Encounter (Signed)
Attempted to reach pt. With follow up call following endoscopic procedure yesterday.   LM on pt. Ans. Machine.   Will try to reach pt. Later today. 

## 2016-06-14 NOTE — Telephone Encounter (Signed)
  Follow up Call-  Call back number 06/13/2016  Post procedure Call Back phone  # [phone]  Permission to leave phone message Yes  Some recent data might be hidden     Patient questions:  Do you have a fever, pain , or abdominal swelling? No. Pain Score  0 *  Have you tolerated food without any problems? Yes.    Have you been able to return to your normal activities? Yes.    Do you have any questions about your discharge instructions: Diet   No. Medications  No. Follow up visit  No.  Do you have questions or concerns about your Care? No.  Actions: * If pain score is 4 or above: No action needed, pain <4.

## 2016-06-30 ENCOUNTER — Encounter: Payer: Self-pay | Admitting: Gastroenterology

## 2016-07-13 IMAGING — CR DG CHEST 1V PORT
2 series · 2 of 2 positions shown · non-contrast
Comparison: None.

CLINICAL DATA: 63-year-old male with level 2 trauma

EXAM:
PORTABLE CHEST - 1 VIEW

[AP (1 of 2)]
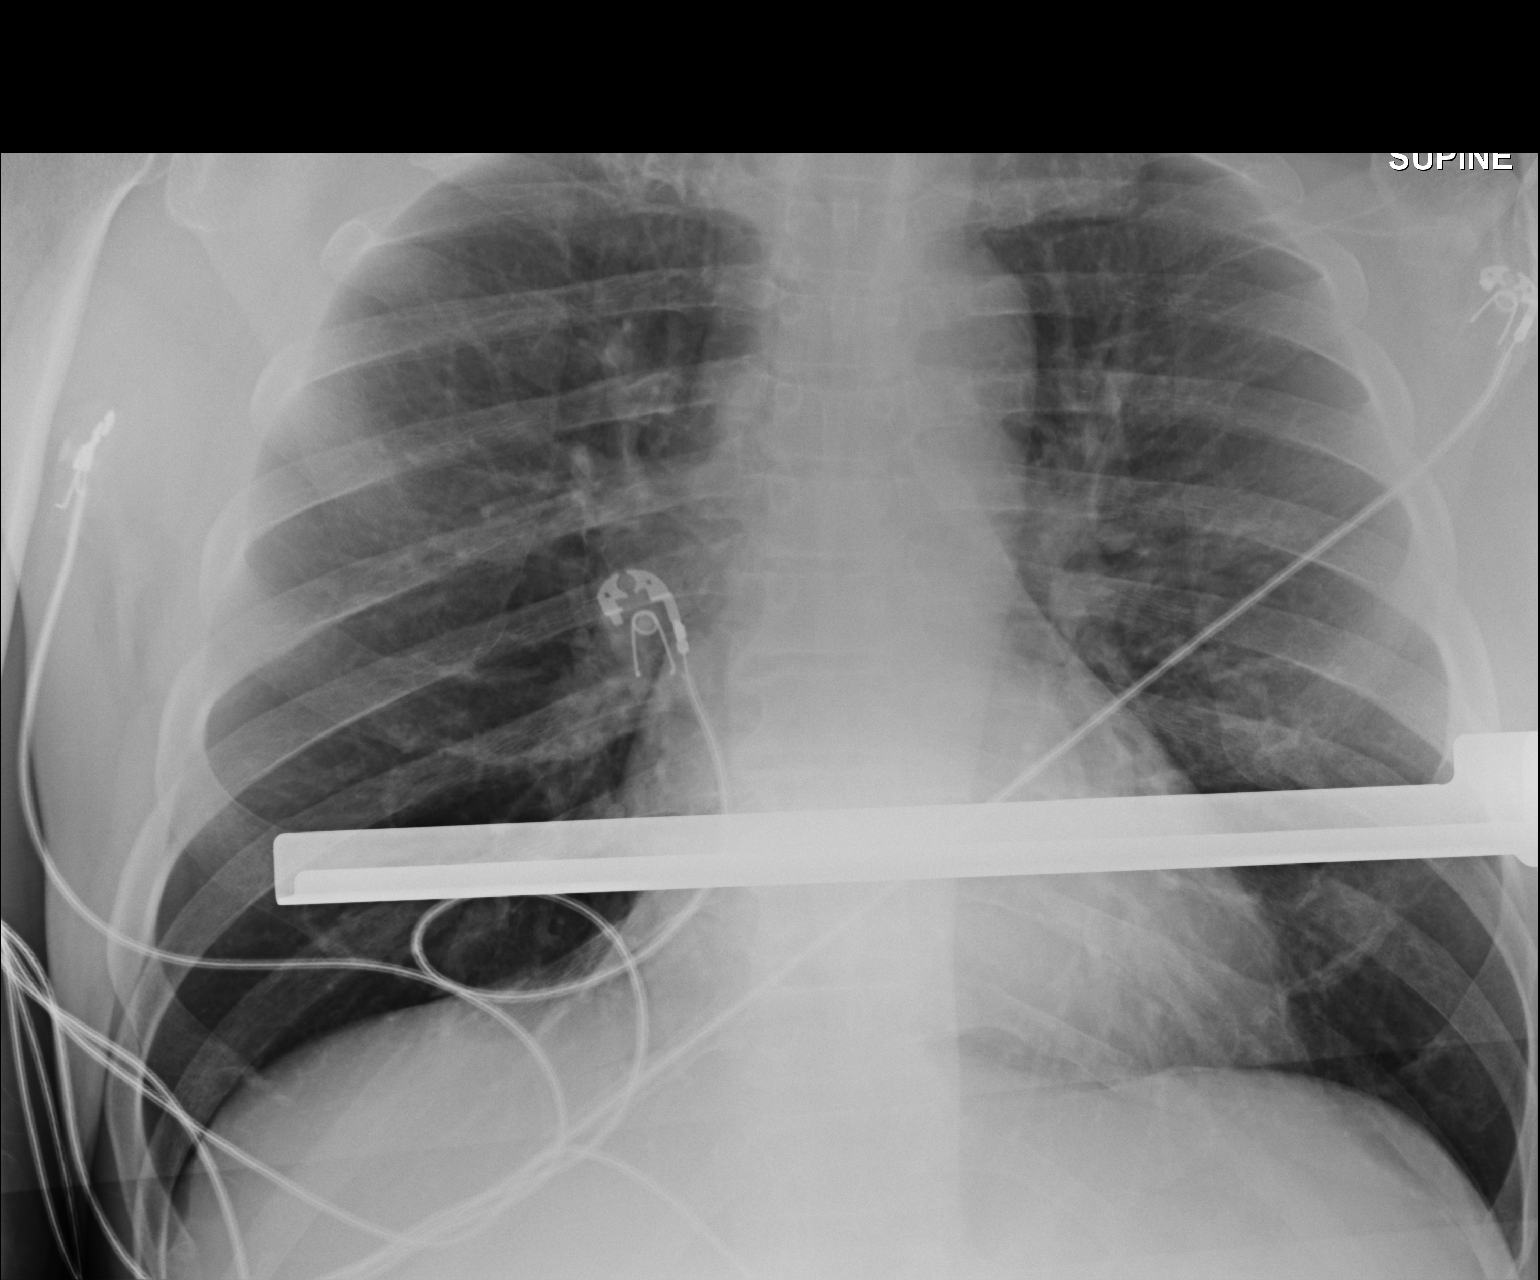

[AP (2 of 2)]
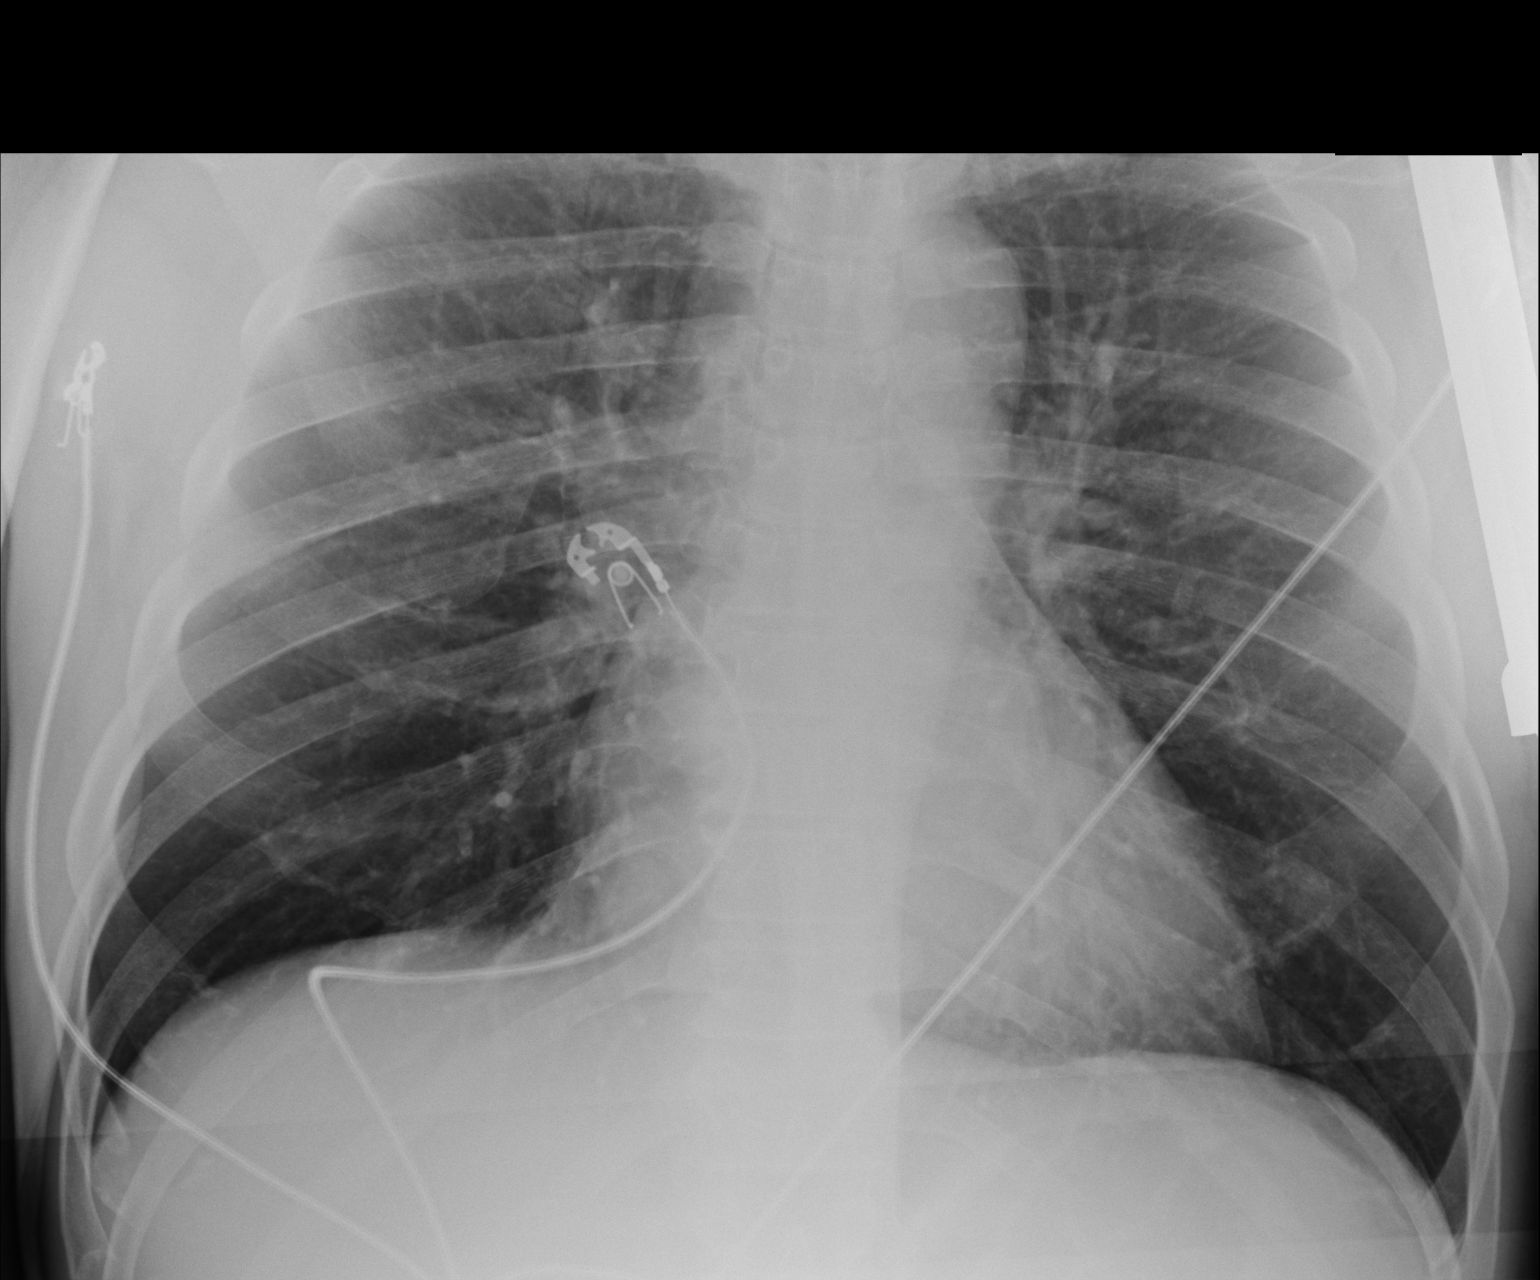

[2 of 2 positions shown; findings below may reference images not displayed]

FINDINGS: The heart size and mediastinal contours are within normal limits.
Both lungs are clear. The visualized skeletal structures are
unremarkable.
IMPRESSION: No active disease.

## 2016-07-13 IMAGING — RF DG TIBIA/FIBULA 2V*R*
1 series · 5 of 5 positions shown · non-contrast
Comparison: Right tibia/fibula radiographs performed earlier today
at [DATE] p.m., and CTA runoff of the lower extremities performed
earlier today at [DATE] p.m.

CLINICAL DATA: External fixation of right tibia/fibula fracture.
Initial encounter.

EXAM:
DG C-ARM 61-120 MIN; RIGHT TIBIA AND FIBULA - 2 VIEW

[Series 1: run · 5 of 5 slices shown]
[im 1/5]
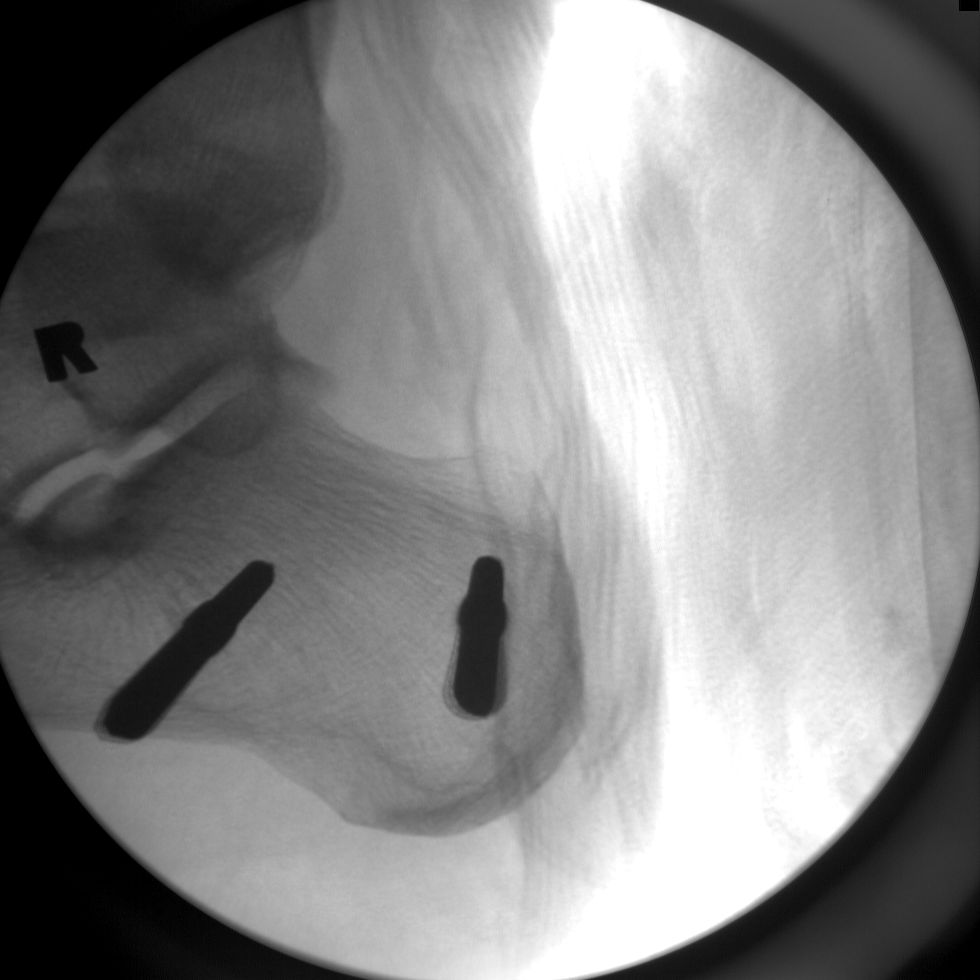
[im 2/5]
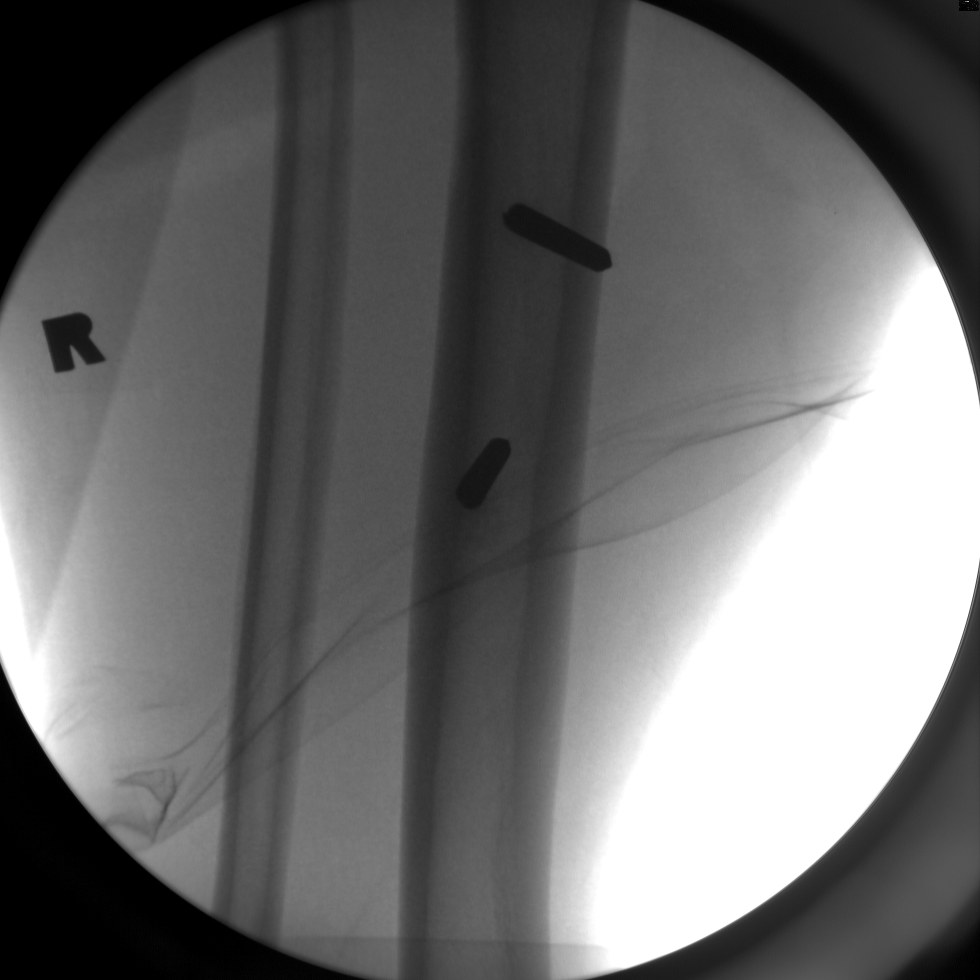
[im 3/5]
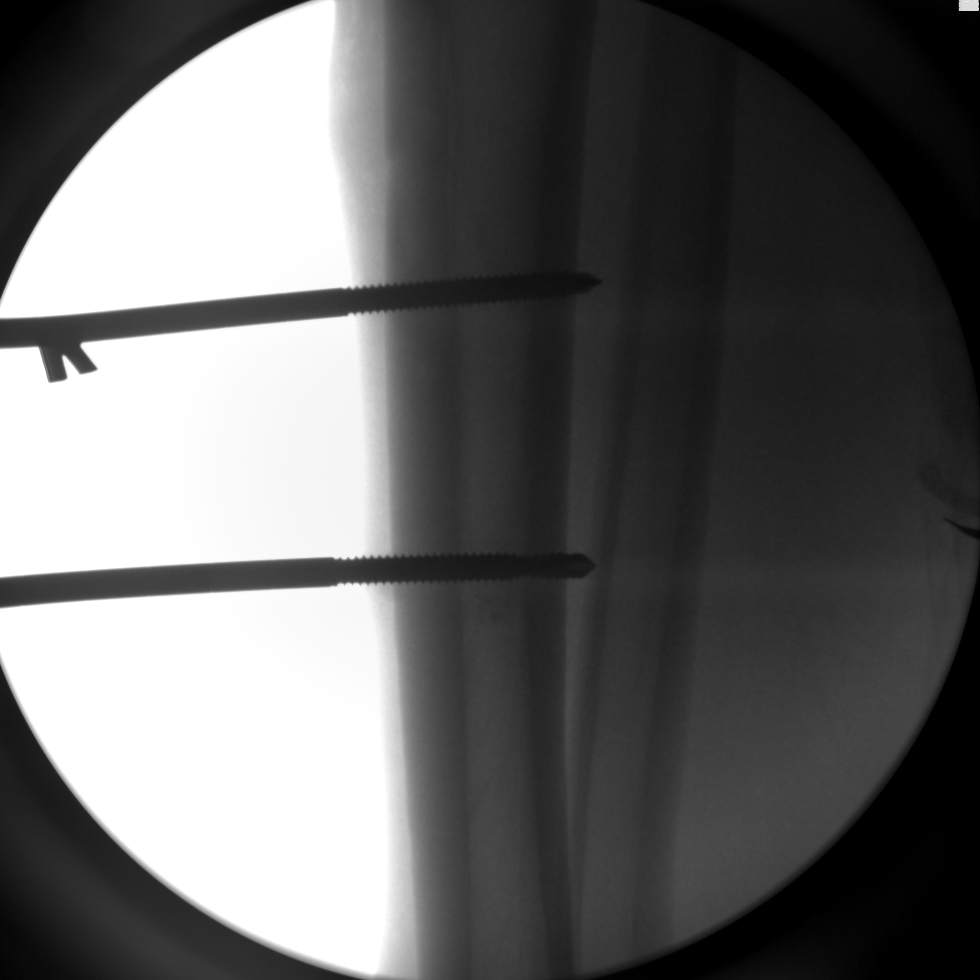
[im 4/5]
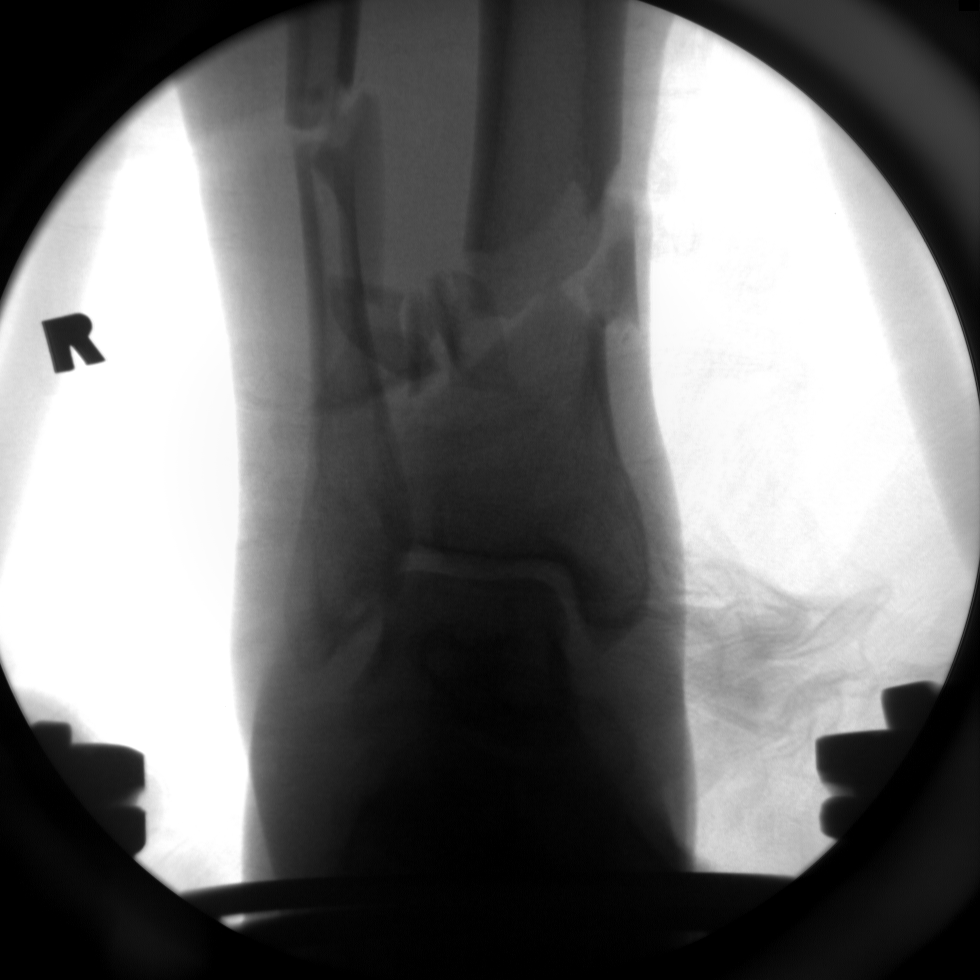
[im 5/5]
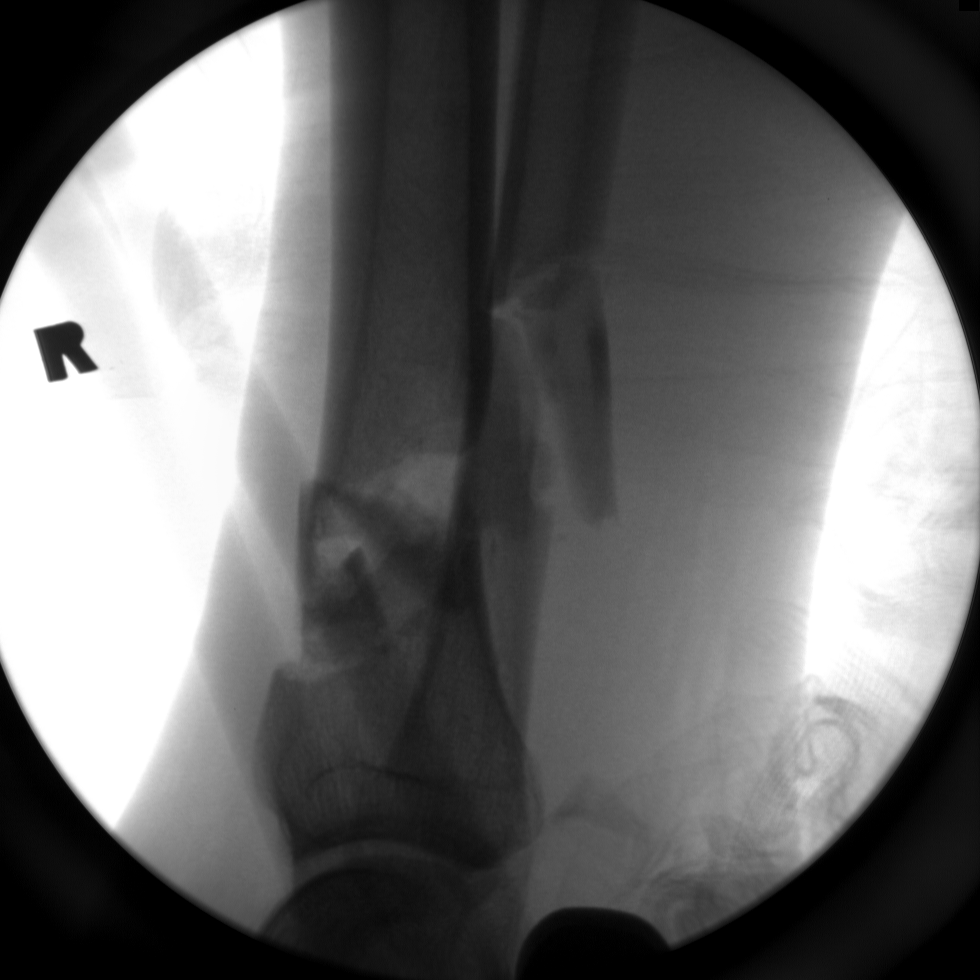

[5 of 5 positions shown; findings below may reference images not displayed]

FINDINGS: Five fluoroscopic C-arm images are provided from the OR. These
demonstrate external fixation of the patient's comminuted tibial and
fibular fractures. No new fractures are seen. The fractures are seen
in improved alignment.
IMPRESSION: Status post external fixation of comminuted tibial and fibular
fractures, in improved alignment.

## 2016-07-17 IMAGING — RF DG CLAVICLE*R*
1 series · 1 of 1 positions shown · non-contrast
Comparison: None.

CLINICAL DATA: ORIF right clavicle.

EXAM:
RIGHT CLAVICLE - 2+ VIEWS

[Series 1: run · 1 of 1 slices shown]
[im 1/1]
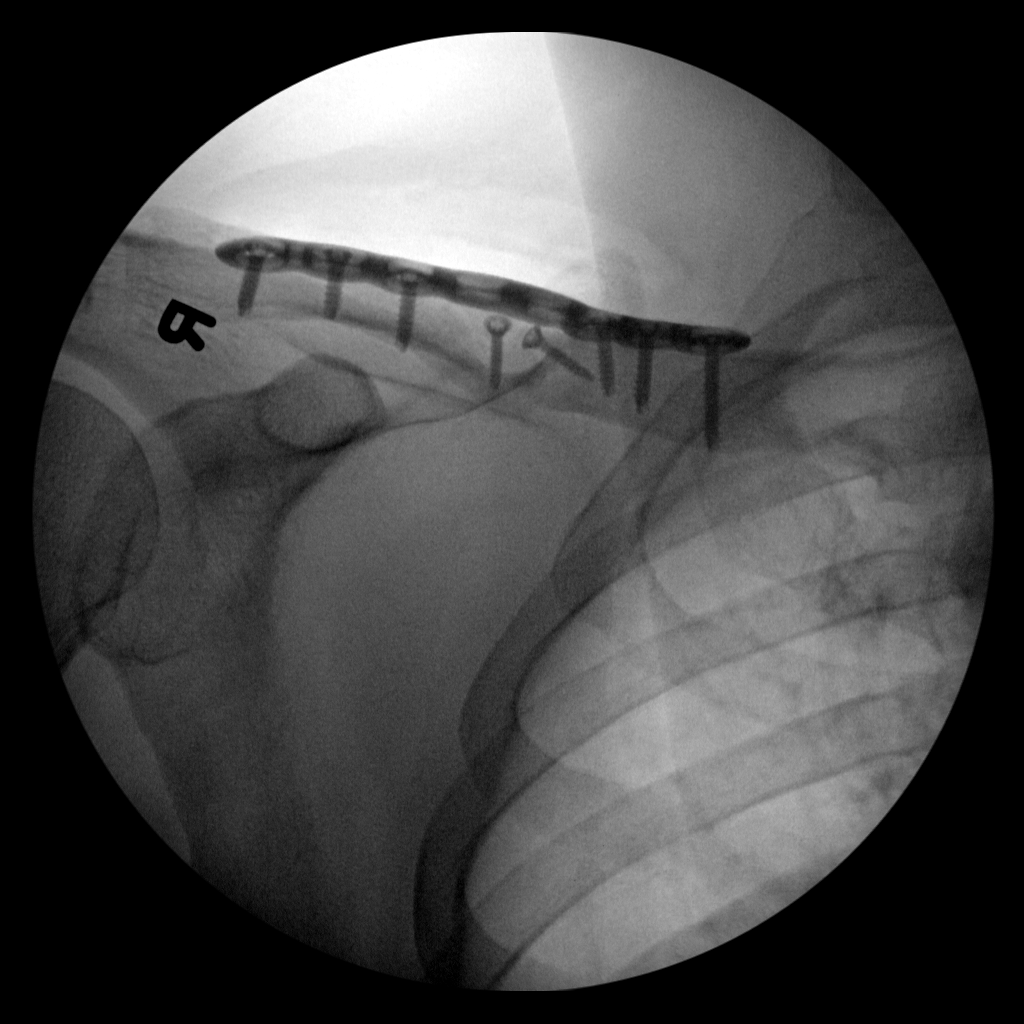

[1 of 1 positions shown; findings below may reference images not displayed]

FINDINGS: 0 minutes 2 seconds fluoroscopy time. One image. Open reduction
internal fixation right clavicle with good anatomic alignment.
IMPRESSION: ORIF right clavicle with good anatomic alignment on one view .

## 2016-07-17 IMAGING — RF DG C-ARM GT 120 MIN
1 series · 4 of 4 positions shown · non-contrast
Comparison: CT 10/25/2014.

CLINICAL DATA: Right ankle ORIF.

EXAM:
DG C-ARM GT 120 MIN; RIGHT TIBIA AND FIBULA - 2 VIEW

[Series 1: run · 4 of 4 slices shown]
[im 1/4]
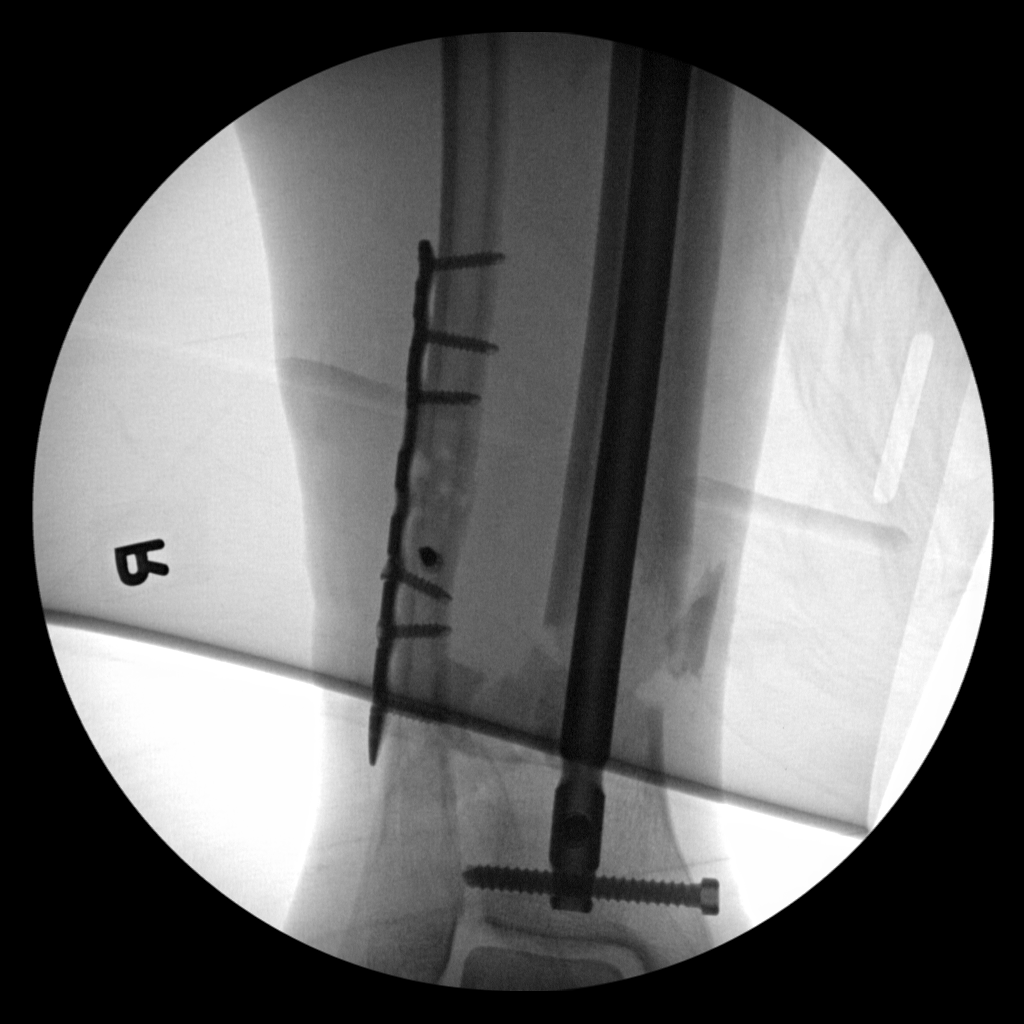
[im 2/4]
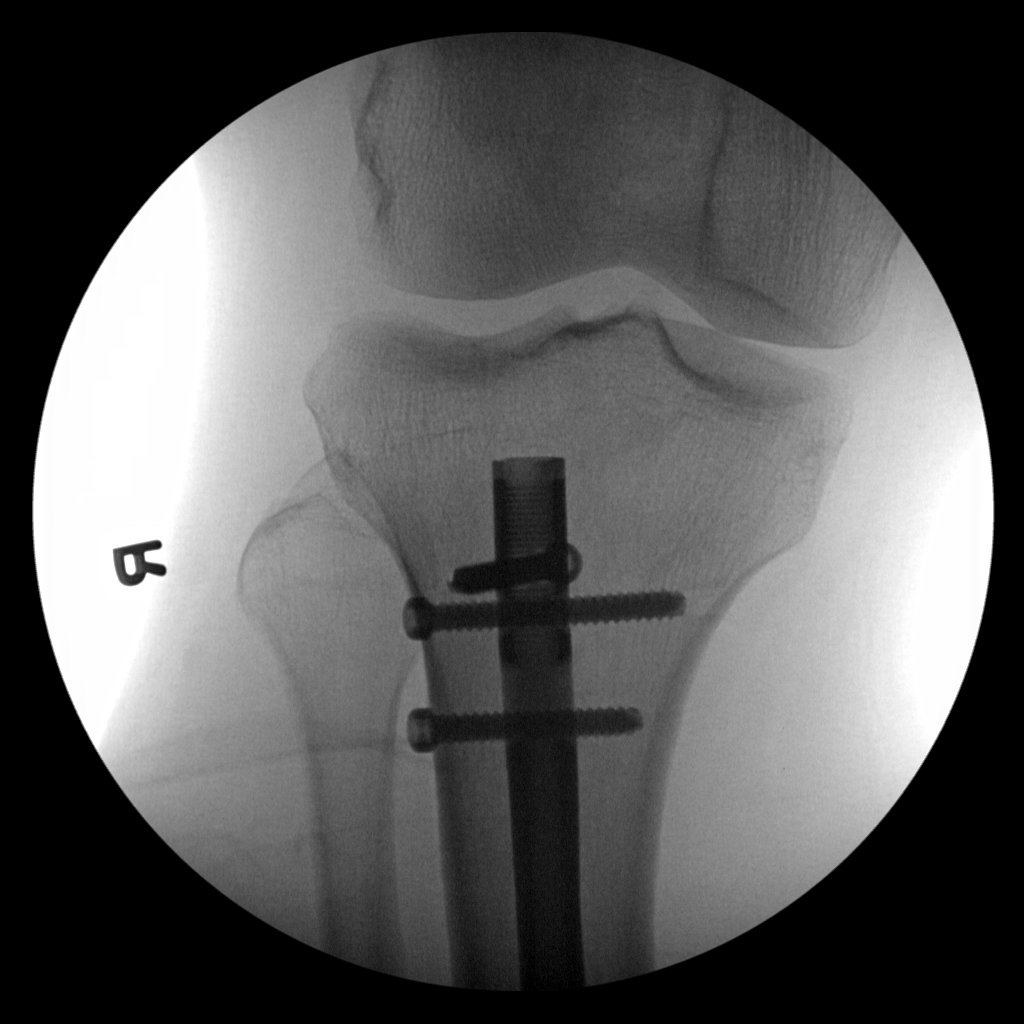
[im 3/4]
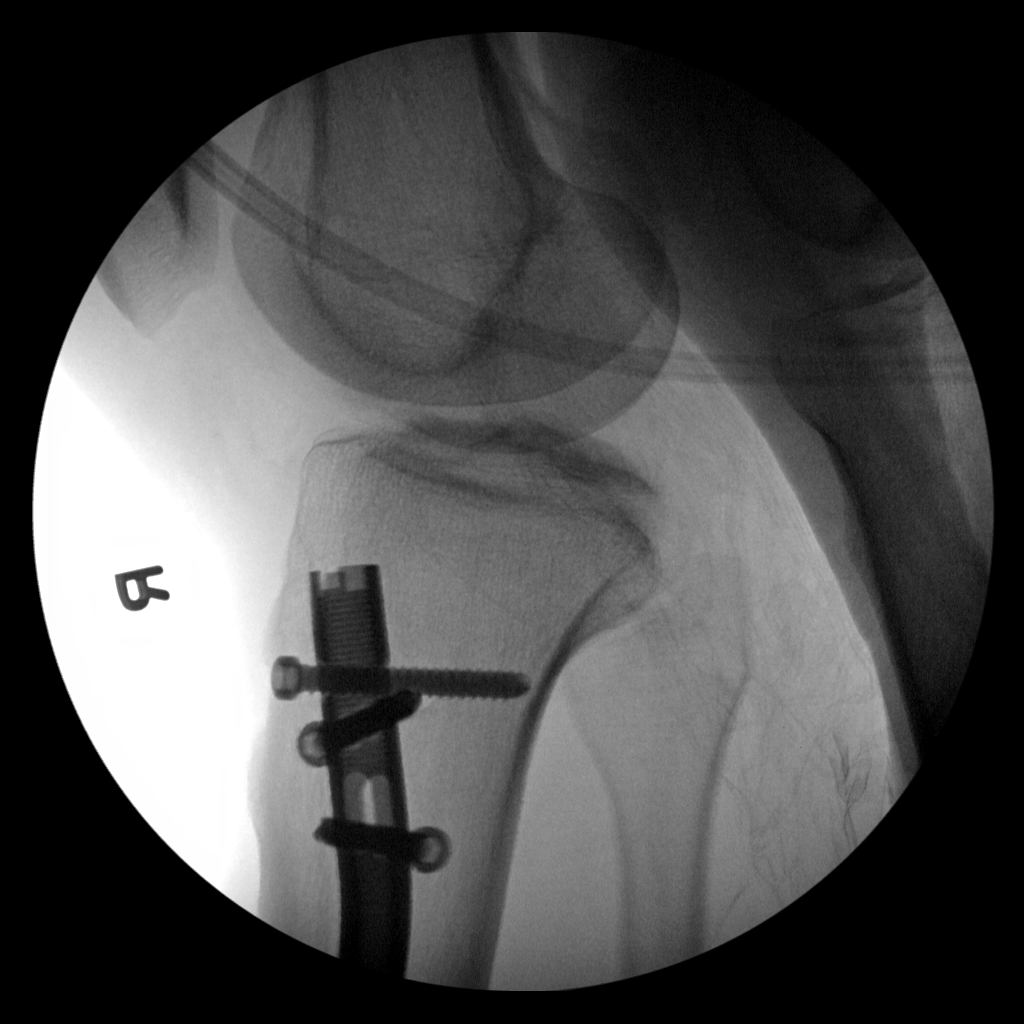
[im 4/4]
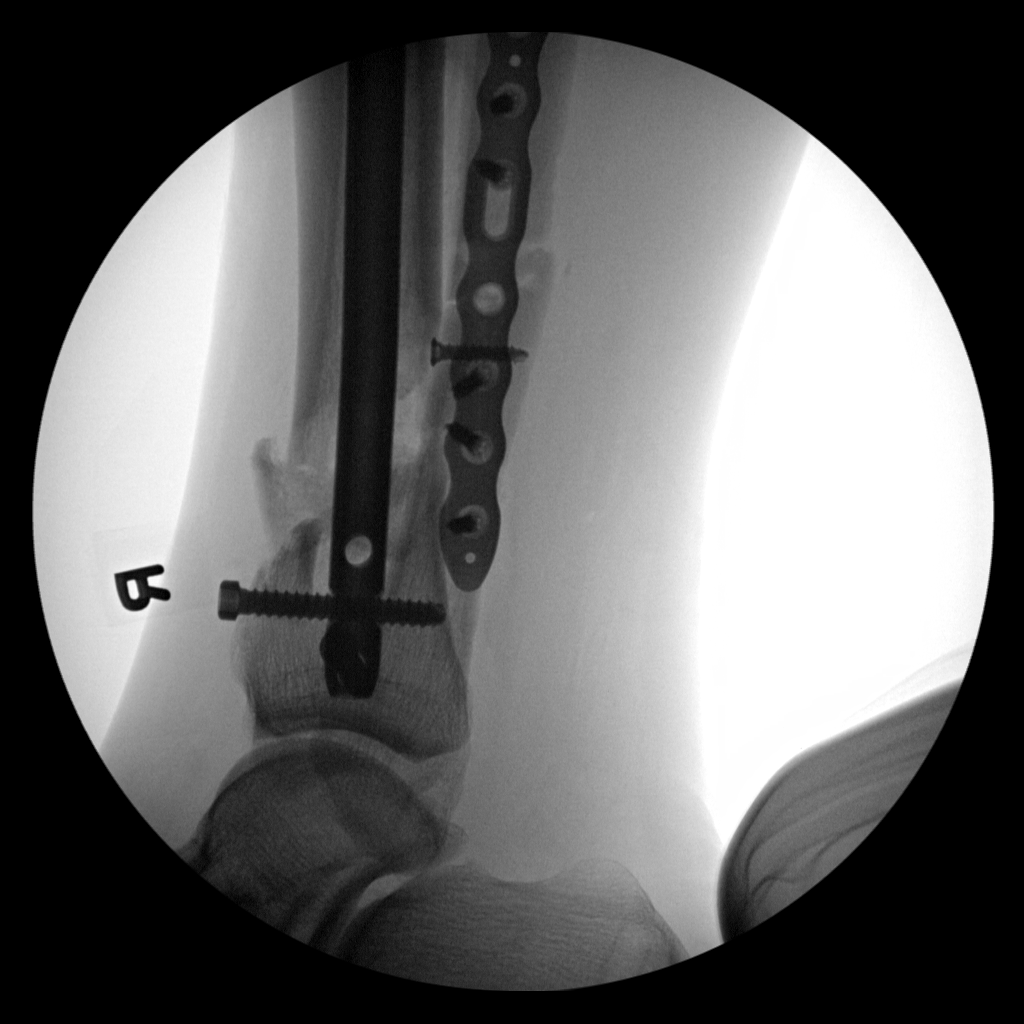

[4 of 4 positions shown; findings below may reference images not displayed]

FLUOROSCOPY TIME:  1 min.

C-arm fluoroscopic images were obtained intraoperatively and
submitted for post operative interpretation. Please see the
performing provider's procedural report for the fluoroscopy time
utilized.
FINDINGS: Four spot fluoroscopic images demonstrate the placement of a tibial
intramedullary nail secured by 3 proximal and 2 distal interlocking
screws. The extensively comminuted fracture of the distal tibial
diaphysis demonstrates near anatomic reduction. Small butterfly
fragments remain mildly displaced. A distal fibular plate and screws
have been placed with near anatomic reduction of the distal fibular
fracture. There is no significant widening of the ankle mortise or
dislocation of the talus.
IMPRESSION: Near anatomic reduction of distal tibial and fibular fractures post
ORIF.

## 2017-05-26 ENCOUNTER — Ambulatory Visit: Payer: Self-pay | Admitting: *Deleted

## 2017-05-26 NOTE — Telephone Encounter (Signed)
Needs to be seen in not improved with OTC anti-histamines.  He could  Add OTC Pepcid or Zantac which may help. Did not feel comfortable calling in prednisone without assessing first

## 2017-05-26 NOTE — Telephone Encounter (Signed)
Dr Elease Hashimoto, please see below message; please advise.

## 2017-05-26 NOTE — Telephone Encounter (Signed)
  Pt. Refuses office visit. "I feel too bad - I'm not going anywhere." Request "something be called in." Instructed to call 911 if hives worsen or has shortness of breath. Pt. Taking Benadryl and using hydrocortisone. Reason for Disposition . [1] MODERATE-SEVERE hives persist (i.e., hives interfere with normal activities or work) AND [2] taking antihistamine (e.g., Benadryl, Claritin) > 24 hours  Answer Assessment - Initial Assessment Questions 1. APPEARANCE: "What does the rash look like?"      Clusters 2. LOCATION: "Where is the rash located?"      All over - including face 3. NUMBER: "How many hives are there?"      Numerous 4. SIZE: "How big are the hives?" (inches, cm, compare to coins) "Do they all look the same or is there lots of variation in shape and size?"      All different sizes 5. ONSET: "When did the hives begin?" (Hours or days ago)      3 days ago 6. ITCHING: "Does it itch?" If so, ask: "How bad is the itch?"    - MILD: doesn't interfere with normal activities   - MODERATE-SEVERE: interferes with work, school, sleep, or other activities      Severe 7. RECURRENT PROBLEM: "Have you had hives before?" If so, ask: "When was the last time?" and "What happened that time?"      Yes 8. TRIGGERS: "Were you exposed to any new food, plant, cosmetic product or animal just before the hives began?"     No 9. OTHER SYMPTOMS: "Do you have any other symptoms?" (e.g., fever, tongue swelling, difficulty breathing, abdominal pain)     No 10. PREGNANCY: "Is there any chance you are pregnant?" "When was your last menstrual period?"       No  Protocols used: HIVES-A-AH

## 2017-05-26 NOTE — Telephone Encounter (Signed)
I spoke with pt and went over recommendations, pt declined to schedule a office visit at this time.

## 2017-11-07 ENCOUNTER — Encounter (HOSPITAL_COMMUNITY): Payer: Self-pay | Admitting: Emergency Medicine

## 2017-11-07 ENCOUNTER — Inpatient Hospital Stay (HOSPITAL_COMMUNITY)
Admission: EM | Admit: 2017-11-07 | Discharge: 2017-11-08 | DRG: 312 | Disposition: A | Discharge status: Home / Self Care | Payer: MEDICARE | Attending: Internal Medicine | Admitting: Internal Medicine

## 2017-11-07 ENCOUNTER — Emergency Department (HOSPITAL_COMMUNITY): Payer: MEDICARE

## 2017-11-07 ENCOUNTER — Other Ambulatory Visit (HOSPITAL_COMMUNITY): Payer: Medicare Other

## 2017-11-07 ENCOUNTER — Other Ambulatory Visit: Payer: Self-pay

## 2017-11-07 DIAGNOSIS — L509 Urticaria, unspecified: Secondary | ICD-10-CM | POA: Diagnosis present

## 2017-11-07 DIAGNOSIS — N179 Acute kidney failure, unspecified: Secondary | ICD-10-CM | POA: Diagnosis not present

## 2017-11-07 DIAGNOSIS — R55 Syncope and collapse: Secondary | ICD-10-CM | POA: Diagnosis present

## 2017-11-07 DIAGNOSIS — I951 Orthostatic hypotension: Secondary | ICD-10-CM

## 2017-11-07 DIAGNOSIS — F1290 Cannabis use, unspecified, uncomplicated: Secondary | ICD-10-CM | POA: Diagnosis present

## 2017-11-07 DIAGNOSIS — Z79899 Other long term (current) drug therapy: Secondary | ICD-10-CM

## 2017-11-07 DIAGNOSIS — R69 Illness, unspecified: Secondary | ICD-10-CM | POA: Diagnosis not present

## 2017-11-07 DIAGNOSIS — K529 Noninfective gastroenteritis and colitis, unspecified: Secondary | ICD-10-CM | POA: Diagnosis present

## 2017-11-07 DIAGNOSIS — Z7982 Long term (current) use of aspirin: Secondary | ICD-10-CM | POA: Diagnosis not present

## 2017-11-07 DIAGNOSIS — S0101XA Laceration without foreign body of scalp, initial encounter: Secondary | ICD-10-CM | POA: Diagnosis not present

## 2017-11-07 DIAGNOSIS — I361 Nonrheumatic tricuspid (valve) insufficiency: Secondary | ICD-10-CM | POA: Diagnosis not present

## 2017-11-07 DIAGNOSIS — F329 Major depressive disorder, single episode, unspecified: Secondary | ICD-10-CM | POA: Diagnosis present

## 2017-11-07 DIAGNOSIS — R21 Rash and other nonspecific skin eruption: Secondary | ICD-10-CM | POA: Diagnosis present

## 2017-11-07 DIAGNOSIS — R197 Diarrhea, unspecified: Secondary | ICD-10-CM | POA: Diagnosis not present

## 2017-11-07 DIAGNOSIS — E860 Dehydration: Secondary | ICD-10-CM | POA: Insufficient documentation

## 2017-11-07 DIAGNOSIS — S0181XA Laceration without foreign body of other part of head, initial encounter: Secondary | ICD-10-CM | POA: Diagnosis not present

## 2017-11-07 DIAGNOSIS — R29810 Facial weakness: Secondary | ICD-10-CM | POA: Diagnosis present

## 2017-11-07 DIAGNOSIS — F419 Anxiety disorder, unspecified: Secondary | ICD-10-CM | POA: Diagnosis present

## 2017-11-07 DIAGNOSIS — R42 Dizziness and giddiness: Secondary | ICD-10-CM | POA: Diagnosis not present

## 2017-11-07 DIAGNOSIS — W19XXXA Unspecified fall, initial encounter: Secondary | ICD-10-CM | POA: Diagnosis present

## 2017-11-07 DIAGNOSIS — R112 Nausea with vomiting, unspecified: Secondary | ICD-10-CM | POA: Diagnosis not present

## 2017-11-07 DIAGNOSIS — Z87891 Personal history of nicotine dependence: Secondary | ICD-10-CM | POA: Diagnosis not present

## 2017-11-07 DIAGNOSIS — E559 Vitamin D deficiency, unspecified: Secondary | ICD-10-CM | POA: Diagnosis present

## 2017-11-07 DIAGNOSIS — E1140 Type 2 diabetes mellitus with diabetic neuropathy, unspecified: Secondary | ICD-10-CM | POA: Diagnosis present

## 2017-11-07 DIAGNOSIS — I959 Hypotension, unspecified: Secondary | ICD-10-CM | POA: Diagnosis not present

## 2017-11-07 DIAGNOSIS — R000 Tachycardia, unspecified: Secondary | ICD-10-CM | POA: Diagnosis not present

## 2017-11-07 DIAGNOSIS — E119 Type 2 diabetes mellitus without complications: Secondary | ICD-10-CM

## 2017-11-07 LAB — URINALYSIS, ROUTINE W REFLEX MICROSCOPIC
Bilirubin Urine: NEGATIVE
Glucose, UA: 50 mg/dL — AB
HGB URINE DIPSTICK: NEGATIVE
Ketones, ur: NEGATIVE mg/dL
Leukocytes, UA: NEGATIVE
Nitrite: NEGATIVE
Protein, ur: 30 mg/dL — AB
Specific Gravity, Urine: 1.02 (ref 1.005–1.030)
pH: 5 (ref 5.0–8.0)

## 2017-11-07 LAB — BASIC METABOLIC PANEL
ANION GAP: 17 — AB (ref 5–15)
Anion gap: 8 (ref 5–15)
Anion gap: 11 (ref 5–15)
Anion gap: 8 (ref 5–15)
BUN: 21 mg/dL (ref 8–23)
BUN: 24 mg/dL — ABNORMAL HIGH (ref 8–23)
BUN: 24 mg/dL — ABNORMAL HIGH (ref 8–23)
BUN: 22 mg/dL (ref 8–23)
CO2: 20 mmol/L — ABNORMAL LOW (ref 22–32)
CO2: 26 mmol/L (ref 22–32)
CO2: 21 mmol/L — ABNORMAL LOW (ref 22–32)
CO2: 22 mmol/L (ref 22–32)
Calcium: 8.9 mg/dL (ref 8.9–10.3)
Calcium: 7.8 mg/dL — ABNORMAL LOW (ref 8.9–10.3)
Calcium: 7.5 mg/dL — ABNORMAL LOW (ref 8.9–10.3)
Calcium: 7.6 mg/dL — ABNORMAL LOW (ref 8.9–10.3)
Chloride: 98 mmol/L (ref 98–111)
Chloride: 106 mmol/L (ref 98–111)
Chloride: 106 mmol/L (ref 98–111)
Chloride: 108 mmol/L (ref 98–111)
Creatinine, Ser: 2.57 mg/dL — ABNORMAL HIGH (ref 0.61–1.24)
Creatinine, Ser: 2.29 mg/dL — ABNORMAL HIGH (ref 0.61–1.24)
Creatinine, Ser: 2.14 mg/dL — ABNORMAL HIGH (ref 0.61–1.24)
Creatinine, Ser: 1.87 mg/dL — ABNORMAL HIGH (ref 0.61–1.24)
GFR calc Af Amer: 33 mL/min — ABNORMAL LOW (ref >60)
GFR calc Af Amer: 36 mL/min — ABNORMAL LOW (ref >60)
GFR calc Af Amer: 42 mL/min — ABNORMAL LOW (ref >60)
GFR calc non Af Amer: 28 mL/min — ABNORMAL LOW (ref >60)
GFR calc non Af Amer: 31 mL/min — ABNORMAL LOW (ref >60)
GFR calc non Af Amer: 36 mL/min — ABNORMAL LOW (ref >60)
GFR, EST AFRICAN AMERICAN: 29 mL/min — AB (ref >60)
GFR, EST NON AFRICAN AMERICAN: 25 mL/min — AB (ref >60)
GLUCOSE: 289 mg/dL — AB (ref 70–99)
Glucose, Bld: 114 mg/dL — ABNORMAL HIGH (ref 70–99)
Glucose, Bld: 151 mg/dL — ABNORMAL HIGH (ref 70–99)
Glucose, Bld: 101 mg/dL — ABNORMAL HIGH (ref 70–99)
POTASSIUM: 4.2 mmol/L (ref 3.5–5.1)
Potassium: 4.1 mmol/L (ref 3.5–5.1)
Potassium: 3.7 mmol/L (ref 3.5–5.1)
Potassium: 3.7 mmol/L (ref 3.5–5.1)
SODIUM: 135 mmol/L (ref 135–145)
Sodium: 140 mmol/L (ref 135–145)
Sodium: 138 mmol/L (ref 135–145)
Sodium: 138 mmol/L (ref 135–145)

## 2017-11-07 LAB — LIPID PANEL
Cholesterol: 99 mg/dL (ref 0–200)
HDL: 31 mg/dL — AB (ref >40)
LDL CALC: 46 mg/dL (ref 0–99)
Total CHOL/HDL Ratio: 3.2 RATIO
Triglycerides: 112 mg/dL (ref <150)
VLDL: 22 mg/dL (ref 0–40)

## 2017-11-07 LAB — HEPATIC FUNCTION PANEL
ALBUMIN: 3.5 g/dL (ref 3.5–5.0)
ALK PHOS: 59 U/L (ref 38–126)
ALT: 14 U/L (ref 0–44)
AST: 20 U/L (ref 15–41)
Bilirubin, Direct: 0.3 mg/dL — ABNORMAL HIGH (ref 0.0–0.2)
Indirect Bilirubin: 0.8 mg/dL (ref 0.3–0.9)
Total Bilirubin: 1.1 mg/dL (ref 0.3–1.2)
Total Protein: 6 g/dL — ABNORMAL LOW (ref 6.5–8.1)

## 2017-11-07 LAB — CBC
HEMATOCRIT: 55.2 % — AB (ref 39.0–52.0)
HEMOGLOBIN: 19.2 g/dL — AB (ref 13.0–17.0)
MCH: 30 pg (ref 26.0–34.0)
MCHC: 34.8 g/dL (ref 30.0–36.0)
MCV: 86.3 fL (ref 78.0–100.0)
Platelets: 246 10*3/uL (ref 150–400)
RBC: 6.4 MIL/uL — ABNORMAL HIGH (ref 4.22–5.81)
RDW: 13.4 % (ref 11.5–15.5)
WBC: 6.3 10*3/uL (ref 4.0–10.5)

## 2017-11-07 LAB — ETHANOL: Alcohol, Ethyl (B): <10 mg/dL (ref <10)

## 2017-11-07 LAB — RAPID URINE DRUG SCREEN, HOSP PERFORMED
Amphetamines: NOT DETECTED
BENZODIAZEPINES: NOT DETECTED
Barbiturates: NOT DETECTED
Cocaine: POSITIVE — AB
OPIATES: NOT DETECTED
TETRAHYDROCANNABINOL: POSITIVE — AB

## 2017-11-07 LAB — CBG MONITORING, ED
Glucose-Capillary: 267 mg/dL — ABNORMAL HIGH (ref 70–99)
Glucose-Capillary: 107 mg/dL — ABNORMAL HIGH (ref 70–99)

## 2017-11-07 LAB — GLUCOSE, CAPILLARY: Glucose-Capillary: 107 mg/dL — ABNORMAL HIGH (ref 70–99)

## 2017-11-07 LAB — LIPASE, BLOOD: Lipase: 22 U/L (ref 11–51)

## 2017-11-07 LAB — HEMOGLOBIN A1C
Hgb A1c MFr Bld: 5.8 % — ABNORMAL HIGH (ref 4.8–5.6)
Mean Plasma Glucose: 119.76 mg/dL

## 2017-11-07 LAB — MAGNESIUM: Magnesium: 1.8 mg/dL (ref 1.7–2.4)

## 2017-11-07 LAB — LACTIC ACID, PLASMA: LACTIC ACID, VENOUS: 2.8 mmol/L — AB (ref 0.5–1.9)

## 2017-11-07 LAB — BETA-HYDROXYBUTYRIC ACID: Beta-Hydroxybutyric Acid: 0.26 mmol/L (ref 0.05–0.27)

## 2017-11-07 LAB — TROPONIN I: Troponin I: <0.03 ng/mL (ref <0.03)

## 2017-11-07 MED ORDER — SODIUM CHLORIDE 0.9 % IV BOLUS
1000.0000 mL | Freq: Once | INTRAVENOUS | Status: AC
  Administered 2017-11-07: 1000 mL via INTRAVENOUS

## 2017-11-07 MED ORDER — INSULIN ASPART 100 UNIT/ML ~~LOC~~ SOLN: 0.0000 [IU] | Freq: Three times a day (TID) | SUBCUTANEOUS | Status: DC

## 2017-11-07 MED ORDER — HEPARIN SODIUM (PORCINE) 5000 UNIT/ML IJ SOLN
5000.0000 [IU] | Freq: Three times a day (TID) | INTRAMUSCULAR | Status: DC
  Administered 2017-11-07 – 2017-11-08 (×4): 5000 [IU] via SUBCUTANEOUS
  Filled 2017-11-07 (×4): qty 1

## 2017-11-07 MED ORDER — CALCIUM CARBONATE-VITAMIN D 500-200 MG-UNIT PO TABS
1.0000 | ORAL_TABLET | Freq: Every day | ORAL | Status: DC
  Administered 2017-11-08: 1 via ORAL
  Filled 2017-11-07: qty 1

## 2017-11-07 MED ORDER — DIPHENHYDRAMINE HCL 25 MG PO CAPS
25.0000 mg | ORAL_CAPSULE | Freq: Four times a day (QID) | ORAL | Status: DC | PRN
  Administered 2017-11-07 – 2017-11-08 (×2): 25 mg via ORAL
  Filled 2017-11-07 (×2): qty 1

## 2017-11-07 MED ORDER — ASPIRIN EC 81 MG PO TBEC
81.0000 mg | DELAYED_RELEASE_TABLET | Freq: Every day | ORAL | Status: DC
  Administered 2017-11-08: 81 mg via ORAL
  Filled 2017-11-07: qty 1

## 2017-11-07 MED ORDER — ACETAMINOPHEN 325 MG PO TABS: 650.0000 mg | ORAL_TABLET | Freq: Four times a day (QID) | ORAL | Status: DC | PRN

## 2017-11-07 MED ORDER — INSULIN GLARGINE 100 UNIT/ML ~~LOC~~ SOLN
15.0000 [IU] | Freq: Every day | SUBCUTANEOUS | Status: DC
  Administered 2017-11-07 – 2017-11-08 (×2): 15 [IU] via SUBCUTANEOUS
  Filled 2017-11-07 (×2): qty 0.15

## 2017-11-07 MED ORDER — ACETAMINOPHEN 650 MG RE SUPP: 650.0000 mg | Freq: Four times a day (QID) | RECTAL | Status: DC | PRN

## 2017-11-07 MED ORDER — LIDOCAINE-EPINEPHRINE (PF) 2 %-1:200000 IJ SOLN
10.0000 mL | Freq: Once | INTRAMUSCULAR | Status: AC
  Administered 2017-11-07: 10 mL via INTRADERMAL
  Filled 2017-11-07: qty 20

## 2017-11-07 MED ORDER — CALCIUM CITRATE-VITAMIN D 315-200 MG-UNIT PO TABS: 1.0000 | ORAL_TABLET | Freq: Every day | ORAL | Status: DC

## 2017-11-07 MED ORDER — SODIUM CHLORIDE 0.9 % IV SOLN
INTRAVENOUS | Status: DC
  Administered 2017-11-07: 15:00:00 via INTRAVENOUS

## 2017-11-07 MED ORDER — ONDANSETRON HCL 4 MG/2ML IJ SOLN
4.0000 mg | Freq: Once | INTRAMUSCULAR | Status: AC
  Administered 2017-11-07: 4 mg via INTRAVENOUS
  Filled 2017-11-07: qty 2

## 2017-11-07 NOTE — Progress Notes (Addendum)
Patient around to the unit alert and oriented X 4 Denies pain. Stiches above right eye due to a fall at home. He is broke out in hives all over his body. Patient states they came up Sunday afternoon. He said he gets these every so often. Nurses answered all questions and concerns Bed in the lowest position with bed alarm set. Call light in reach.

## 2017-11-07 NOTE — ED Notes (Signed)
Admitting paged about elevated lactic acid

## 2017-11-07 NOTE — ED Triage Notes (Signed)
Pt. Stated, I woke up at 430 got up and felt faint and fell hit my head on the corner(laceration over rt. Eye). Im nausea, and I have whelps that started yesterday.

## 2017-11-07 NOTE — ED Notes (Signed)
Main lab to add on BMP

## 2017-11-07 NOTE — ED Notes (Signed)
Pt given urinal, aware of need for urine sample.
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
## 2017-11-07 NOTE — ED Notes (Signed)
Main lab to add on beta-hydroxybutyric acid
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
## 2017-11-07 NOTE — ED Notes (Signed)
Pt was unable to do standing orthostatic he stood up but felt dizzy pt sat down.

## 2017-11-08 ENCOUNTER — Inpatient Hospital Stay (HOSPITAL_COMMUNITY): Payer: MEDICARE

## 2017-11-08 DIAGNOSIS — R55 Syncope and collapse: Secondary | ICD-10-CM | POA: Diagnosis present

## 2017-11-08 DIAGNOSIS — R42 Dizziness and giddiness: Secondary | ICD-10-CM

## 2017-11-08 DIAGNOSIS — E860 Dehydration: Secondary | ICD-10-CM | POA: Diagnosis not present

## 2017-11-08 DIAGNOSIS — S0181XA Laceration without foreign body of other part of head, initial encounter: Secondary | ICD-10-CM | POA: Diagnosis not present

## 2017-11-08 DIAGNOSIS — I361 Nonrheumatic tricuspid (valve) insufficiency: Secondary | ICD-10-CM

## 2017-11-08 DIAGNOSIS — K529 Noninfective gastroenteritis and colitis, unspecified: Secondary | ICD-10-CM | POA: Diagnosis not present

## 2017-11-08 DIAGNOSIS — R69 Illness, unspecified: Secondary | ICD-10-CM | POA: Diagnosis not present

## 2017-11-08 DIAGNOSIS — F1290 Cannabis use, unspecified, uncomplicated: Secondary | ICD-10-CM | POA: Diagnosis not present

## 2017-11-08 DIAGNOSIS — I951 Orthostatic hypotension: Secondary | ICD-10-CM | POA: Diagnosis not present

## 2017-11-08 DIAGNOSIS — R29810 Facial weakness: Secondary | ICD-10-CM | POA: Diagnosis not present

## 2017-11-08 DIAGNOSIS — E1140 Type 2 diabetes mellitus with diabetic neuropathy, unspecified: Secondary | ICD-10-CM | POA: Diagnosis not present

## 2017-11-08 DIAGNOSIS — N179 Acute kidney failure, unspecified: Secondary | ICD-10-CM | POA: Diagnosis not present

## 2017-11-08 DIAGNOSIS — E559 Vitamin D deficiency, unspecified: Secondary | ICD-10-CM | POA: Diagnosis not present

## 2017-11-08 DIAGNOSIS — R21 Rash and other nonspecific skin eruption: Secondary | ICD-10-CM | POA: Diagnosis not present

## 2017-11-08 LAB — BASIC METABOLIC PANEL
Anion gap: 5 (ref 5–15)
Anion gap: 6 (ref 5–15)
BUN: 17 mg/dL (ref 8–23)
BUN: 17 mg/dL (ref 8–23)
CHLORIDE: 108 mmol/L (ref 98–111)
CHLORIDE: 109 mmol/L (ref 98–111)
CO2: 24 mmol/L (ref 22–32)
CO2: 23 mmol/L (ref 22–32)
Calcium: 7.7 mg/dL — ABNORMAL LOW (ref 8.9–10.3)
Calcium: 7.9 mg/dL — ABNORMAL LOW (ref 8.9–10.3)
Creatinine, Ser: 1.45 mg/dL — ABNORMAL HIGH (ref 0.61–1.24)
Creatinine, Ser: 1.4 mg/dL — ABNORMAL HIGH (ref 0.61–1.24)
GFR calc Af Amer: 57 mL/min — ABNORMAL LOW (ref >60)
GFR calc Af Amer: 60 mL/min — ABNORMAL LOW (ref >60)
GFR calc non Af Amer: 49 mL/min — ABNORMAL LOW (ref >60)
GFR calc non Af Amer: 52 mL/min — ABNORMAL LOW (ref >60)
GLUCOSE: 86 mg/dL (ref 70–99)
GLUCOSE: 93 mg/dL (ref 70–99)
POTASSIUM: 3.9 mmol/L (ref 3.5–5.1)
POTASSIUM: 3.7 mmol/L (ref 3.5–5.1)
Sodium: 137 mmol/L (ref 135–145)
Sodium: 138 mmol/L (ref 135–145)

## 2017-11-08 LAB — COMPREHENSIVE METABOLIC PANEL
ALBUMIN: 2.9 g/dL — AB (ref 3.5–5.0)
ALT: 11 U/L (ref 0–44)
AST: 19 U/L (ref 15–41)
Alkaline Phosphatase: 43 U/L (ref 38–126)
Anion gap: 8 (ref 5–15)
BILIRUBIN TOTAL: 0.9 mg/dL (ref 0.3–1.2)
BUN: 20 mg/dL (ref 8–23)
CHLORIDE: 108 mmol/L (ref 98–111)
CO2: 23 mmol/L (ref 22–32)
Calcium: 7.8 mg/dL — ABNORMAL LOW (ref 8.9–10.3)
Creatinine, Ser: 1.59 mg/dL — ABNORMAL HIGH (ref 0.61–1.24)
GFR calc Af Amer: 51 mL/min — ABNORMAL LOW (ref >60)
GFR calc non Af Amer: 44 mL/min — ABNORMAL LOW (ref >60)
GLUCOSE: 97 mg/dL (ref 70–99)
POTASSIUM: 3.9 mmol/L (ref 3.5–5.1)
Sodium: 139 mmol/L (ref 135–145)
Total Protein: 5.1 g/dL — ABNORMAL LOW (ref 6.5–8.1)

## 2017-11-08 LAB — GLUCOSE, CAPILLARY
GLUCOSE-CAPILLARY: 74 mg/dL (ref 70–99)
GLUCOSE-CAPILLARY: 94 mg/dL (ref 70–99)
Glucose-Capillary: 79 mg/dL (ref 70–99)

## 2017-11-08 LAB — HIV ANTIBODY (ROUTINE TESTING W REFLEX): HIV Screen 4th Generation wRfx: NONREACTIVE

## 2017-11-08 LAB — ECHOCARDIOGRAM COMPLETE
HEIGHTINCHES: 68 in
WEIGHTICAEL: 2560 [oz_av]

## 2017-11-08 MED ORDER — METFORMIN HCL 500 MG PO TABS: 500.0000 mg | ORAL_TABLET | Freq: Two times a day (BID) | ORAL | 1 refills | Status: DC

## 2017-11-08 NOTE — Care Management Note (Signed)
Case Management Note  Patient Details  Name: Richard [name] MRN: [id number] Date of Birth: 1951-03-02  Subjective/Objective:                    Action/Plan: Pt discharging home with self care. Pt has PCP, insurance and transportation home.    Expected Discharge Date:  11/08/17               Expected Discharge Plan:  Home/Self Care  In-House Referral:     Discharge planning Services     Post Acute Care Choice:    Choice offered to:     DME Arranged:    DME Agency:     HH Arranged:    HH Agency:     Status of Service:  Completed, signed off  If discussed at [employer] of Stay Meetings, dates discussed:    Additional Comments:  Pollie Friar, RN 11/08/2017, 4:30 PM

## 2017-11-08 NOTE — Progress Notes (Signed)
Pt discharged home. Discharge instructions were reviewed with the pt/ PT verbalized understanding. Pt states that he has to wait till his daughter's boyfriends get off work before he can be picked up.
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
## 2017-11-08 NOTE — Progress Notes (Signed)
PT Cancellation Note  Patient Details Name: Richard [name] MRN: [id number] DOB: 1950/04/21   Cancelled Treatment:    Reason Eval/Treat Not Completed: Active bedrest order Need increased activity order to proceed with PT. Will follow.   Lanney Gins, PT, DPT 11/08/17 8:01 AM Pager: [phone]

## 2017-11-08 NOTE — Progress Notes (Signed)
  Echocardiogram 2D Echocardiogram has been performed.  Johny Chess 11/08/2017, 9:33 AM

## 2017-11-08 NOTE — Discharge Summary (Signed)
Physician Discharge Summary  Richard [name] [id number]:[id number] DOB: 09/20/1950 DOA: 11/07/2017  PCP: Marletta Lor, MD  Admit date: 11/07/2017 Discharge date: 11/08/2017  Admitted From: home Disposition:  Home   Recommendations for Outpatient Follow-up:  1. Follow up with PCP in 1-2 weeks  Home Health: none Equipment/Devices: none  Discharge Condition: stable CODE STATUS: Full code Diet recommendation: diabetic   HPI: Per Sharene Butters, PA Richard [name] is a 67 y.o. male with medical history significant for depression, not seen by his PCP in over one year, presenting for evaluation of a fall and a presyncopal episode.  In review, the patient developed what he thought he was a viral GI illness a day prior, with persisting vomiting and watery, nonbloody diarrhea.  While this diarrhea eventually subsided last night, his nausea and vomiting persisted this morning, especially since 3 AM, for which she sought medical attention.  Around that time, he got up to get some cold water, developing dizziness, without loss of consciousness, striking his right forehead against the door.  This was unwitnessed.  He denies any history of similar events, seizures, or stroke.  He denies any tongue biting, or urine incontinence at the time.  In fact, he has been increasingly thirsty over the last 3 weeks, with significant polyuria.  He denies any fever or chills, but he does have profuse sweating.  He denies any abdominal pain.  He denies any food poisoning (although he did eat salad last night).  He denies any sick contacts.  He denies any chest pain or palpitations.  He denies any shortness of breath or cough.  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  He drinks 4 beers a week, he smokes marijuana about 3 times a week, last consumed yesterday night.  He denies any tick bites, but he does have significant diffuse rash all over his body, which he reports is  not new, and appears when "I have stress".  He denies any recent surgeries.  He has never been seen by a cardiologist or had a cardiac catheterization or stress test.  It is reported.  He denies any history of diabetes.  No dysarthria or dysphagia.  No new medications.  He denies any over-the-counter products or new herbal products  Hospital Course: Syncope -patient admitted to the hospital with a syncopal episode.  This is likely in the setting of recent significant volume loss due to nausea vomiting and diarrhea.  CT scan of the head was negative for acute findings, EKG did not look acute, troponin was negative.  He was back to baseline, able times in the hallway without difficulties, and was discharged home in stable condition. Nausea, vomiting and diarrhea -resolved, possible viral gastroenteritis, this led to significant dehydration, has received fluids and improved, he is able to eat without difficulties Acute kidney injury -likely in the setting of dehydration, creatinine admission 2.57, he received aggressive fluids and his creatinine has improved to 1.4.  He no longer has any GI symptoms and is able to eat and drink well, he will be discharged home in stable condition and was advised to follow-up with PCP within 1 to 2 weeks for repeat BMP Prediabetes -hemoglobin A1c 5.8.  Do not suspect he truly had DKA on admission but was possibly acidotic due to GI losses/dehydration/poor p.o. Intake. Will place on metformin Diffuse pruritic rash -on and off for the past year, he has noticed a relationship with some  amounts and sweats, has been taking Benadryl as needed.  Rash has resolved, recommend outpatient follow-up with dermatology  Discharge Diagnoses:  Principal Problem:   Postural dizziness with near syncope Active Problems:   Diabetes (HCC)   AKI (acute kidney injury) ([place])   Nausea and vomiting   Syncope     Discharge Instructions   Allergies as of 11/08/2017   No Known Allergies       Medication List    TAKE these medications   aspirin EC 81 MG tablet Take 81 mg by mouth daily.   CALCIUM + D PO Take 1 tablet by mouth daily.   gabapentin 300 MG capsule Commonly known as:  NEURONTIN Take 1 capsule (300 mg total) by mouth 3 (three) times daily.   ibuprofen 200 MG tablet Commonly known as:  ADVIL,MOTRIN Take 800 mg by mouth daily.   metFORMIN 500 MG tablet Commonly known as:  GLUCOPHAGE Take 1 tablet (500 mg total) by mouth 2 (two) times daily with a meal.   valACYclovir 1000 MG tablet Commonly known as:  VALTREX Take 1 tablet by mouth twice a day for 5 days for outbreaks. What changed:    how much to take  how to take this  when to take this  additional instructions   VITAMIN D (CHOLECALCIFEROL) PO Take 1 capsule by mouth daily.        Consultations:  None   Procedures/Studies:  2D echo  Study Conclusions - Left ventricle: The cavity size was normal. Systolic function was normal. The estimated ejection fraction was in the range of 50% to 55%. Wall motion was normal; there were no regional wall motion abnormalities. Left ventricular diastolic function parameters were normal.  Ct Head Wo Contrast  Result Date: 11/07/2017 CLINICAL DATA:  Fall.  Laceration over the right eye. EXAM: CT HEAD WITHOUT CONTRAST TECHNIQUE: Contiguous axial images were obtained from the base of the skull through the vertex without intravenous contrast. COMPARISON:  None. FINDINGS: Brain: No evidence of acute infarction, hemorrhage, hydrocephalus, extra-axial collection or mass lesion/mass effect. Vascular: No hyperdense vessel or unexpected calcification. Skull: Normal. Negative for fracture or focal lesion. Sinuses/Orbits: No acute finding. Scattered mild to moderate paranasal sinus mucosal thickening. No air-fluid levels. The mastoid air cells are clear. Other: Small right frontal scalp laceration just above the right eye. IMPRESSION: 1. No acute intracranial  abnormality. Small right frontal scalp laceration. Electronically Signed   By: Titus Dubin M.D.   On: 11/07/2017 10:17      Subjective: - no chest pain, shortness of breath, no abdominal pain, nausea or vomiting.   Discharge Exam: Vitals:   11/08/17 0400 11/08/17 1213  BP: 119/68 110/83  Pulse: 99 78  Resp: 12 20  Temp: 98.7 F (37.1 C) 98.8 F (37.1 C)  SpO2: 94% 98%    General: Pt is alert, awake, not in acute distress Cardiovascular: RRR, S1/S2 +, no rubs, no gallops Respiratory: CTA bilaterally, no wheezing, no rhonchi Abdominal: Soft, NT, ND, bowel sounds + Extremities: no edema, no cyanosis    The results of significant diagnostics from this hospitalization (including imaging, microbiology, ancillary and laboratory) are listed below for reference.     Microbiology: No results found for this or any previous visit (from the past 240 hour(s)).   Labs: BNP (last 3 results) No results for input(s): BNP in the last 8760 hours. Basic Metabolic Panel: Recent Labs  Lab 11/07/17 1332  11/07/17 1917 11/07/17 2316 11/08/17 0405 11/08/17 1054 11/08/17 1443  NA  --    < > 138 138 139 137 138  K  --    < > 3.7 3.7 3.9 3.9 3.7  CL  --    < > 106 108 108 108 109  CO2  --    < > 21* 22 23 24 23   GLUCOSE  --    < > 151* 101* 97 86 93  BUN  --    < > 24* 22 20 17 17   CREATININE  --    < > 2.14* 1.87* 1.59* 1.45* 1.40*  CALCIUM  --    < > 7.5* 7.6* 7.8* 7.7* 7.9*  MG 1.8  --   --   --   --   --   --    < > = values in this interval not displayed.   Liver Function Tests: Recent Labs  Lab 11/07/17 1332 11/08/17 0405  AST 20 19  ALT 14 11  ALKPHOS 59 43  BILITOT 1.1 0.9  PROT 6.0* 5.1*  ALBUMIN 3.5 2.9*   Recent Labs  Lab 11/07/17 1332  LIPASE 22   No results for input(s): AMMONIA in the last 168 hours. CBC: Recent Labs  Lab 11/07/17 0854  WBC 6.3  HGB 19.2*  HCT 55.2*  MCV 86.3  PLT 246   Cardiac Enzymes: Recent Labs  Lab 11/07/17 1332   TROPONINI <0.03   BNP: Invalid input(s): POCBNP CBG: Recent Labs  Lab 11/07/17 1716 11/07/17 2108 11/08/17 0601 11/08/17 1147 11/08/17 1541  GLUCAP 107* 107* 79 74 94   D-Dimer No results for input(s): DDIMER in the last 72 hours. Hgb A1c Recent Labs    11/07/17 0854  HGBA1C 5.8*   Lipid Profile Recent Labs    11/07/17 1332  CHOL 99  HDL 31*  LDLCALC 46  TRIG 112  CHOLHDL 3.2   Thyroid function studies No results for input(s): TSH, T4TOTAL, T3FREE, THYROIDAB in the last 72 hours.  Invalid input(s): FREET3 Anemia work up No results for input(s): VITAMINB12, FOLATE, FERRITIN, TIBC, IRON, RETICCTPCT in the last 72 hours. Urinalysis    Component Value Date/Time   COLORURINE AMBER (A) 11/07/2017 1307   APPEARANCEUR CLOUDY (A) 11/07/2017 1307   LABSPEC 1.020 11/07/2017 1307   PHURINE 5.0 11/07/2017 1307   GLUCOSEU 50 (A) 11/07/2017 1307   HGBUR NEGATIVE 11/07/2017 1307   BILIRUBINUR NEGATIVE 11/07/2017 1307   KETONESUR NEGATIVE 11/07/2017 1307   PROTEINUR 30 (A) 11/07/2017 1307   NITRITE NEGATIVE 11/07/2017 1307   LEUKOCYTESUR NEGATIVE 11/07/2017 1307   Sepsis Labs Invalid input(s): PROCALCITONIN,  WBC,  LACTICIDVEN   Time coordinating discharge: 35 minutes  SIGNED:  Marzetta Board, MD  [facility] 11/08/2017, 6:00 PM Pager [phone]  If 7PM-7AM, please contact night-coverage [email or web address] Password [email or web address]
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
## 2017-11-09 ENCOUNTER — Telehealth: Payer: Self-pay | Admitting: Family Medicine

## 2017-11-09 NOTE — Telephone Encounter (Signed)
I left a voice message for pt to return my call.  

## 2017-11-09 NOTE — Telephone Encounter (Signed)
Transition Care Management Follow-up Telephone Call  Richard [name] [id number] DOB: 08-19-1950 DOA: 11/07/2017  PCP: Marletta Lor, MD  Admit date: 11/07/2017 Discharge date: 11/08/2017  Admitted From: home Disposition:  Home   Recommendations for Outpatient Follow-up:  1. Follow up with PCP in 1-2 weeks  Home Health: none Equipment/Devices: none  Discharge Condition: stable CODE STATUS: Full code Diet recommendation: diabetic   HPI: Per Sharene Butters, PA Thea Gist Kigeris a [phone] y.o.malewith medical history significant fordepression, not seen by his PCP in over one year, presenting for evaluation of a fall and a presyncopal episode. In review, the patient developed what he thought he was a viral GI illness a day prior, with persisting vomiting and watery, nonbloody diarrhea. While this diarrhea eventually subsided last night, his nausea and vomiting persisted this morning, especially since 3 AM, for which she sought medical attention. Around that time, he got up to get some cold water, developing dizziness, without loss of consciousness, striking his right forehead against the door. This was unwitnessed. He denies any history of similar events, seizures, or stroke. He denies any tongue biting, or urine incontinence at the time. In fact, he has been increasingly thirsty over the last 3 weeks, with significant polyuria. He denies any fever or chills, but he does have profuse sweating. He denies any abdominal pain. He denies any food poisoning (although he did eat salad last night). He denies any sick contacts. He denies any chest pain or palpitations. He denies any shortness of breath or cough. 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 He drinks 4 beers a week, he smokes marijuana about 3 times a week, last consumed yesterday night. He denies any tick bites, but he does have significant diffuse rash all over his  body, which he reports is not new, and appears when "I have stress".He denies any recent surgeries. He has never been seen by a cardiologist or had a cardiac catheterization or stress test. It is reported. He denies any history of diabetes. No dysarthria or dysphagia. No new medications. He denies any over-the-counter products or new herbal products    How have you been since you were released from the hospital? "better"   Do you understand why you were in the hospital? yes   Do you understand the discharge instructions? yes   Where were you discharged to? Home   Items Reviewed:  Medications reviewed: yes  Allergies reviewed: yes  Dietary changes reviewed: yes  Referrals reviewed: yes   Functional Questionnaire:   Activities of Daily Living (ADLs):   He states they are independent in the following: ambulation, bathing and hygiene, feeding, continence, grooming, toileting and dressing States they require assistance with the following: none   Any transportation issues/concerns?: no   Any patient concerns? no   Confirmed importance and date/time of follow-up visits scheduled yes  Provider Appointment booked with Dr. Burnice Logan on 11/13/2017 Monday at 4:00 pm  Confirmed with patient if condition begins to worsen call PCP or go to the ER.  Patient was given the office number and encouraged to call back with question or concerns.  : yes

## 2017-11-13 ENCOUNTER — Encounter: Payer: Self-pay | Admitting: Internal Medicine

## 2017-11-13 ENCOUNTER — Ambulatory Visit (INDEPENDENT_AMBULATORY_CARE_PROVIDER_SITE_OTHER): Payer: Medicare HMO | Admitting: Internal Medicine

## 2017-11-13 VITALS — BP 102/70 | HR 84 | Temp 98.7°F | Wt 156.4 lb

## 2017-11-13 DIAGNOSIS — R7302 Impaired glucose tolerance (oral): Secondary | ICD-10-CM

## 2017-11-13 DIAGNOSIS — R42 Dizziness and giddiness: Secondary | ICD-10-CM

## 2017-11-13 DIAGNOSIS — E860 Dehydration: Secondary | ICD-10-CM | POA: Diagnosis not present

## 2017-11-13 DIAGNOSIS — D239 Other benign neoplasm of skin, unspecified: Secondary | ICD-10-CM | POA: Diagnosis not present

## 2017-11-13 DIAGNOSIS — R55 Syncope and collapse: Secondary | ICD-10-CM

## 2017-11-13 DIAGNOSIS — R112 Nausea with vomiting, unspecified: Secondary | ICD-10-CM | POA: Diagnosis not present

## 2017-11-13 NOTE — Patient Instructions (Addendum)
Return in 3 months for follow-up   Wound Closure Removal The staples, stitches, or skin adhesives that were used to close your skin have been removed. You will need to continue the care described here until the wound is completely healed and your health care provider confirms that wound care can be stopped. How do I care for my wound? How you care for your wound after the wound closure has been removed depends on the kind of wound closure you had. Stitches or Staples  Keep the wound site dry and clean. Do not soak it in water.  If skin adhesive strips were applied after the staples were removed, they will begin to peel off in a few days. Allow them to remain in place until they fall off on their own.  If you still have a bandage (dressing), change it at least once a day or as directed by your health care provider. If the dressing sticks, pour warm, sterile water over it until it loosens and can be removed without pulling apart the wound edges. Pat the area dry with a soft, clean towel. Do not rub the wound because that may cause bleeding.  Apply cream or ointment that stops the growth of bacteria (antibacterial cream or antibacterial ointment) only if your health care provider has directed you to do so.  Place a nonstick bandage over the wound to prevent the dressing from sticking.  Cover the nonstick bandage with a new dressing as directed by your health care provider.  If the bandage becomes wet or dirty or it develops a bad smell, change it as soon as possible.  Take medicines only as directed by your health care provider.  Adhesive Strips or Glue  Adhesive strips and glue peel off on their own.  Leave adhesive strips and glue in place until they fall off.  Are there any bathing restrictions once my wound closure is removed? Do not take baths, swim, or use a hot tub until your health care provider approves. How can I decrease the size of my scar? How your scar heals and the size  of your scar depend on many factors, such as your age, the type of scar you have, and genetic factors. The following may help decrease the size of your scar:  Sunscreen. Use sunscreen with a sun protection factor (SPF) of at least 15 when out in the sun. Reapply the sunscreen every two hours.  Friction massage. Once your wound is completely healed, you can gently massage the scarred area. This can decrease scar thickness.  When should I seek help? Seek help if:  You have a fever.  You have chills.  You have drainage, redness, swelling, or pain at your wound.  There is a bad smell coming from your wound.  Your wound edges open up or do not stay closed after the wound closure has been removed.  This information is not intended to replace advice given to you by your health care provider. Make sure you discuss any questions you have with your health care provider. Document Released: 03/17/2008 Document Revised: 09/16/2015 Document Reviewed: 08/20/2013 Elsevier Interactive Patient Education  2018 [employer].  Diabetes Mellitus and Nutrition When you have diabetes (diabetes mellitus), it is very important to have healthy eating habits because your blood sugar (glucose) levels are greatly affected by what you eat and drink. Eating healthy foods in the appropriate amounts, at about the same times every day, can help you:  Control your blood glucose.  Lower  your risk of heart disease.  Improve your blood pressure.  Reach or maintain a healthy weight.  Every person with diabetes is different, and each person has different needs for a meal plan. Your health care provider may recommend that you work with a diet and nutrition specialist (dietitian) to make a meal plan that is best for you. Your meal plan may vary depending on factors such as:  The calories you need.  The medicines you take.  Your weight.  Your blood glucose, blood pressure, and cholesterol levels.  Your activity  level.  Other health conditions you have, such as heart or kidney disease.  How do carbohydrates affect me? Carbohydrates affect your blood glucose level more than any other type of food. Eating carbohydrates naturally increases the amount of glucose in your blood. Carbohydrate counting is a method for keeping track of how many carbohydrates you eat. Counting carbohydrates is important to keep your blood glucose at a healthy level, especially if you use insulin or take certain oral diabetes medicines. It is important to know how many carbohydrates you can safely have in each meal. This is different for every person. Your dietitian can help you calculate how many carbohydrates you should have at each meal and for snack. Foods that contain carbohydrates include:  Bread, cereal, rice, pasta, and crackers.  Potatoes and corn.  Peas, beans, and lentils.  Milk and yogurt.  Fruit and juice.  Desserts, such as cakes, cookies, ice cream, and candy.  How does alcohol affect me? Alcohol can cause a sudden decrease in blood glucose (hypoglycemia), especially if you use insulin or take certain oral diabetes medicines. Hypoglycemia can be a life-threatening condition. Symptoms of hypoglycemia (sleepiness, dizziness, and confusion) are similar to symptoms of having too much alcohol. If your health care provider says that alcohol is safe for you, follow these guidelines:  Limit alcohol intake to no more than 1 drink per day for nonpregnant women and 2 drinks per day for men. One drink equals 12 oz of beer, 5 oz of wine, or 1 oz of hard liquor.  Do not drink on an empty stomach.  Keep yourself hydrated with water, diet soda, or unsweetened iced tea.  Keep in mind that regular soda, juice, and other mixers may contain a lot of sugar and must be counted as carbohydrates.  What are tips for following this plan? Reading food labels  Start by checking the serving size on the label. The amount of  calories, carbohydrates, fats, and other nutrients listed on the label are based on one serving of the food. Many foods contain more than one serving per package.  Check the total grams (g) of carbohydrates in one serving. You can calculate the number of servings of carbohydrates in one serving by dividing the total carbohydrates by 15. For example, if a food has 30 g of total carbohydrates, it would be equal to 2 servings of carbohydrates.  Check the number of grams (g) of saturated and trans fats in one serving. Choose foods that have low or no amount of these fats.  Check the number of milligrams (mg) of sodium in one serving. Most people should limit total sodium intake to less than 2,300 mg per day.  Always check the nutrition information of foods labeled as "low-fat" or "nonfat". These foods may be higher in added sugar or refined carbohydrates and should be avoided.  Talk to your dietitian to identify your daily goals for nutrients listed on the label. Shopping  Avoid buying canned, premade, or processed foods. These foods tend to be high in fat, sodium, and added sugar.  Shop around the outside edge of the grocery store. This includes fresh fruits and vegetables, bulk grains, fresh meats, and fresh dairy. Cooking  Use low-heat cooking methods, such as baking, instead of high-heat cooking methods like deep frying.  Cook using healthy oils, such as olive, canola, or sunflower oil.  Avoid cooking with butter, cream, or high-fat meats. Meal planning  Eat meals and snacks regularly, preferably at the same times every day. Avoid going long periods of time without eating.  Eat foods high in fiber, such as fresh fruits, vegetables, beans, and whole grains. Talk to your dietitian about how many servings of carbohydrates you can eat at each meal.  Eat 4-6 ounces of lean protein each day, such as lean meat, chicken, fish, eggs, or tofu. 1 ounce is equal to 1 ounce of meat, chicken, or fish,  1 egg, or 1/4 cup of tofu.  Eat some foods each day that contain healthy fats, such as avocado, nuts, seeds, and fish. Lifestyle   Check your blood glucose regularly.  Exercise at least 30 minutes 5 or more days each week, or as told by your health care provider.  Take medicines as told by your health care provider.  Do not use any products that contain nicotine or tobacco, such as cigarettes and e-cigarettes. If you need help quitting, ask your health care provider.  Work with a Social worker or diabetes educator to identify strategies to manage stress and any emotional and social challenges. What are some questions to ask my health care provider?  Do I need to meet with a diabetes educator?  Do I need to meet with a dietitian?  What number can I call if I have questions?  When are the best times to check my blood glucose? Where to find more information:  American Diabetes Association: diabetes.org/food-and-fitness/food  Academy of Nutrition and Dietetics: PokerClues.dk  [employer] of Diabetes and Digestive and Kidney Diseases (NIH): ContactWire.be Summary  A healthy meal plan will help you control your blood glucose and maintain a healthy lifestyle.  Working with a diet and nutrition specialist (dietitian) can help you make a meal plan that is best for you.  Keep in mind that carbohydrates and alcohol have immediate effects on your blood glucose levels. It is important to count carbohydrates and to use alcohol carefully. This information is not intended to replace advice given to you by your health care provider. Make sure you discuss any questions you have with your health care provider. Document Released: 12/30/2004 Document Revised: 05/09/2016 Document Reviewed: 05/09/2016 Elsevier Interactive Patient Education  2018 [employer].  Dermatology  consultation as discussed
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
## 2017-11-14 ENCOUNTER — Telehealth: Payer: Self-pay | Admitting: Internal Medicine

## 2017-11-14 NOTE — Telephone Encounter (Signed)
Okay for refill?  

## 2017-11-14 NOTE — Telephone Encounter (Unsigned)
Copied from [place] [id number]. Topic: Quick Communication - Rx Refill/Question >> Nov 14, 2017  9:22 AM Judyann Munson wrote: Medication:valACYclovir (VALTREX) 1000 MG tablet  Has the patient contacted their pharmacy? yes  Preferred Pharmacy (with phone number or street name): [place], [place] - [address] [phone] (Phone) [phone] (Fax)      Agent: Please be advised that RX refills may take up to 3 business days. We ask that you follow-up with your pharmacy.

## 2017-11-14 NOTE — Telephone Encounter (Signed)
Okay for refill? Please advise 

## 2017-11-15 ENCOUNTER — Other Ambulatory Visit: Payer: Self-pay

## 2017-11-15 MED ORDER — VALACYCLOVIR HCL 1 G PO TABS
ORAL_TABLET | ORAL | 2 refills | Status: DC
Start: 2017-11-15 — End: 2019-05-28

## 2017-11-15 NOTE — Telephone Encounter (Signed)
Patient notified of Rx sent to [facility].

## 2017-12-14 ENCOUNTER — Telehealth: Payer: Self-pay | Admitting: Internal Medicine

## 2017-12-14 NOTE — Telephone Encounter (Signed)
refaxed again  ov notes  To [facility] [address], [place], [place], [place], [place]

## 2017-12-14 NOTE — Telephone Encounter (Signed)
Copied from [place] [id number]. Topic: Referral - Request >> Dec 14, 2017  1:07 PM Keene Breath wrote: Reason for CRM: Tiffany with [facility] called to request that the referral for patient be refaxed with the referral documents.  CB# [phone].

## 2018-02-06 DIAGNOSIS — L989 Disorder of the skin and subcutaneous tissue, unspecified: Secondary | ICD-10-CM | POA: Diagnosis not present

## 2018-02-06 DIAGNOSIS — L814 Other melanin hyperpigmentation: Secondary | ICD-10-CM | POA: Diagnosis not present

## 2018-02-06 DIAGNOSIS — D485 Neoplasm of uncertain behavior of skin: Secondary | ICD-10-CM | POA: Diagnosis not present

## 2018-02-06 DIAGNOSIS — D225 Melanocytic nevi of trunk: Secondary | ICD-10-CM | POA: Diagnosis not present

## 2018-02-06 DIAGNOSIS — C4442 Squamous cell carcinoma of skin of scalp and neck: Secondary | ICD-10-CM | POA: Diagnosis not present

## 2018-03-22 DIAGNOSIS — C4442 Squamous cell carcinoma of skin of scalp and neck: Secondary | ICD-10-CM | POA: Diagnosis not present

## 2018-03-22 DIAGNOSIS — C4362 Malignant melanoma of left upper limb, including shoulder: Secondary | ICD-10-CM | POA: Diagnosis not present

## 2018-03-22 DIAGNOSIS — L905 Scar conditions and fibrosis of skin: Secondary | ICD-10-CM | POA: Diagnosis not present

## 2018-06-15 DIAGNOSIS — Z85820 Personal history of malignant melanoma of skin: Secondary | ICD-10-CM | POA: Diagnosis not present

## 2018-06-15 DIAGNOSIS — L570 Actinic keratosis: Secondary | ICD-10-CM | POA: Diagnosis not present

## 2018-06-15 DIAGNOSIS — Z85828 Personal history of other malignant neoplasm of skin: Secondary | ICD-10-CM | POA: Diagnosis not present

## 2018-06-15 DIAGNOSIS — L720 Epidermal cyst: Secondary | ICD-10-CM | POA: Diagnosis not present

## 2018-06-15 DIAGNOSIS — L814 Other melanin hyperpigmentation: Secondary | ICD-10-CM | POA: Diagnosis not present

## 2018-06-15 DIAGNOSIS — D229 Melanocytic nevi, unspecified: Secondary | ICD-10-CM | POA: Diagnosis not present

## 2018-07-17 ENCOUNTER — Ambulatory Visit (INDEPENDENT_AMBULATORY_CARE_PROVIDER_SITE_OTHER): Payer: Medicare HMO | Admitting: Internal Medicine

## 2018-07-17 ENCOUNTER — Other Ambulatory Visit: Payer: Self-pay

## 2018-07-17 DIAGNOSIS — R7302 Impaired glucose tolerance (oral): Secondary | ICD-10-CM | POA: Diagnosis not present
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
## 2019-01-04 DIAGNOSIS — L814 Other melanin hyperpigmentation: Secondary | ICD-10-CM | POA: Diagnosis not present

## 2019-01-04 DIAGNOSIS — L280 Lichen simplex chronicus: Secondary | ICD-10-CM | POA: Diagnosis not present

## 2019-01-04 DIAGNOSIS — L570 Actinic keratosis: Secondary | ICD-10-CM | POA: Diagnosis not present

## 2019-01-04 DIAGNOSIS — L281 Prurigo nodularis: Secondary | ICD-10-CM | POA: Diagnosis not present

## 2019-01-04 DIAGNOSIS — Z85820 Personal history of malignant melanoma of skin: Secondary | ICD-10-CM | POA: Diagnosis not present

## 2019-01-04 DIAGNOSIS — D229 Melanocytic nevi, unspecified: Secondary | ICD-10-CM | POA: Diagnosis not present

## 2019-01-04 DIAGNOSIS — Z85828 Personal history of other malignant neoplasm of skin: Secondary | ICD-10-CM | POA: Diagnosis not present

## 2019-01-04 DIAGNOSIS — D1801 Hemangioma of skin and subcutaneous tissue: Secondary | ICD-10-CM | POA: Diagnosis not present

## 2019-01-04 DIAGNOSIS — L720 Epidermal cyst: Secondary | ICD-10-CM | POA: Diagnosis not present

## 2019-01-04 DIAGNOSIS — L812 Freckles: Secondary | ICD-10-CM | POA: Diagnosis not present

## 2019-02-13 ENCOUNTER — Other Ambulatory Visit: Payer: Self-pay

## 2019-02-13 DIAGNOSIS — Z20822 Contact with and (suspected) exposure to covid-19: Secondary | ICD-10-CM

## 2019-02-14 LAB — NOVEL CORONAVIRUS, NAA: SARS-CoV-2, NAA: NOT DETECTED

## 2019-02-16 ENCOUNTER — Encounter: Payer: Self-pay | Admitting: Internal Medicine

## 2019-02-21 DIAGNOSIS — R69 Illness, unspecified: Secondary | ICD-10-CM | POA: Diagnosis not present

## 2019-05-27 ENCOUNTER — Telehealth: Payer: Self-pay | Admitting: Internal Medicine

## 2019-05-27 NOTE — Telephone Encounter (Signed)
Pt is requesting a refill for valacyclovir.

## 2019-05-27 NOTE — Telephone Encounter (Signed)
Pt is requesting a refill for Valacycloir.

## 2019-05-28 MED ORDER — VALACYCLOVIR HCL 1 G PO TABS: ORAL_TABLET | ORAL | 0 refills | Status: AC

## 2019-06-01 ENCOUNTER — Ambulatory Visit: Payer: Medicare HMO | Attending: Internal Medicine

## 2019-06-01 DIAGNOSIS — Z23 Encounter for immunization: Secondary | ICD-10-CM

## 2019-06-01 NOTE — Progress Notes (Signed)
   Covid-19 Vaccination Clinic  Name:  Richard [name]    MRN: [id number] DOB: December 15, 1950  06/01/2019  [name] was observed post Covid-19 immunization for 15 minutes without incidence. He was provided with Vaccine Information Sheet and instruction to access the V-Safe system.   [name] was instructed to call 911 with any severe reactions post vaccine: Marland Kitchen Difficulty breathing  . Swelling of your face and throat  . A fast heartbeat  . A bad rash all over your body  . Dizziness and weakness    Immunizations Administered    Name Date Dose VIS Date Route   Pfizer COVID-19 Vaccine 06/01/2019  2:29 PM 0.3 mL 03/29/2019 Intramuscular   Manufacturer: [place]   Lot: [id number]   [place]: [id number]

## 2019-06-24 ENCOUNTER — Ambulatory Visit: Payer: Medicare HMO | Attending: Internal Medicine

## 2019-06-24 DIAGNOSIS — Z23 Encounter for immunization: Secondary | ICD-10-CM

## 2019-06-24 NOTE — Progress Notes (Signed)
   Covid-19 Vaccination Clinic  Name:  Richard [name]    MRN: [id number] DOB: 1950-11-13  06/24/2019  Richard [name] was observed post Covid-19 immunization for 15 minutes without incident. He was provided with Vaccine Information Sheet and instruction to access the V-Safe system.   Richard [name] was instructed to call 911 with any severe reactions post vaccine: Marland Kitchen Difficulty breathing  . Swelling of face and throat  . A fast heartbeat  . A bad rash all over body  . Dizziness and weakness   Immunizations Administered    Name Date Dose VIS Date Route   Pfizer COVID-19 Vaccine 06/24/2019  1:15 PM 0.3 mL 03/29/2019 Intramuscular   Manufacturer: [place]   Lot: [id number]   [place]: [id number]

## 2019-06-28 DIAGNOSIS — Z85828 Personal history of other malignant neoplasm of skin: Secondary | ICD-10-CM | POA: Diagnosis not present

## 2019-06-28 DIAGNOSIS — L280 Lichen simplex chronicus: Secondary | ICD-10-CM | POA: Diagnosis not present

## 2019-06-28 DIAGNOSIS — Z85820 Personal history of malignant melanoma of skin: Secondary | ICD-10-CM | POA: Diagnosis not present

## 2019-06-28 DIAGNOSIS — L570 Actinic keratosis: Secondary | ICD-10-CM | POA: Diagnosis not present

## 2019-06-28 DIAGNOSIS — L905 Scar conditions and fibrosis of skin: Secondary | ICD-10-CM | POA: Diagnosis not present

## 2019-06-28 DIAGNOSIS — L814 Other melanin hyperpigmentation: Secondary | ICD-10-CM | POA: Diagnosis not present

## 2019-08-22 ENCOUNTER — Telehealth: Payer: Self-pay | Admitting: Internal Medicine

## 2019-08-22 NOTE — Telephone Encounter (Signed)
Left message on machine for patient.  Office visit required for more refills.  This can be in office or virtual.

## 2019-08-22 NOTE — Telephone Encounter (Signed)
Pt is requesting a refill on Valacyclovir 1000 mg tablet. Pt uses [facility]. Thanks

## 2019-08-28 NOTE — Addendum Note (Signed)
Addended by: Westley Hummer B on: 08/28/2019 02:02 PM   Modules accepted: Orders

## 2020-03-09 ENCOUNTER — Ambulatory Visit: Payer: Medicare HMO | Attending: Internal Medicine

## 2020-03-09 DIAGNOSIS — Z23 Encounter for immunization: Secondary | ICD-10-CM

## 2020-03-09 NOTE — Progress Notes (Signed)
   Covid-19 Vaccination Clinic  Name:  KANOA PHILLIPPI    MRN: [id number] DOB: 20-May-1950  03/09/2020  Mr. Corsino was observed post Covid-19 immunization for 15 minutes without incident. He was provided with Vaccine Information Sheet and instruction to access the V-Safe system.   Mr. Bingman was instructed to call 911 with any severe reactions post vaccine: Marland Kitchen Difficulty breathing  . Swelling of face and throat  . A fast heartbeat  . A bad rash all over body  . Dizziness and weakness   Immunizations Administered    Name Date Dose VIS Date [place] COVID-19 Vaccine 03/09/2020  2:24 PM 0.3 mL 02/05/2020 Intramuscular   Manufacturer: [place]   Lot: [id number]   [place]: [id number]

## 2020-11-10 ENCOUNTER — Other Ambulatory Visit: Payer: Self-pay

## 2020-11-10 ENCOUNTER — Encounter: Payer: Self-pay | Admitting: Internal Medicine

## 2020-11-10 ENCOUNTER — Ambulatory Visit (INDEPENDENT_AMBULATORY_CARE_PROVIDER_SITE_OTHER): Payer: Medicare HMO | Admitting: Internal Medicine

## 2020-11-10 ENCOUNTER — Ambulatory Visit (INDEPENDENT_AMBULATORY_CARE_PROVIDER_SITE_OTHER)
Admission: RE | Admit: 2020-11-10 | Discharge: 2020-11-10 | Disposition: A | Discharge status: Home / Self Care | Payer: Medicare HMO | Source: Ambulatory Visit | Attending: Internal Medicine | Admitting: Internal Medicine

## 2020-11-10 VITALS — BP 130/80 | HR 56 | Temp 98.0°F | Wt 155.5 lb

## 2020-11-10 DIAGNOSIS — M25561 Pain in right knee: Secondary | ICD-10-CM

## 2020-11-10 DIAGNOSIS — R7302 Impaired glucose tolerance (oral): Secondary | ICD-10-CM | POA: Diagnosis not present

## 2020-11-10 LAB — POCT GLYCOSYLATED HEMOGLOBIN (HGB A1C): Hemoglobin A1C: 5.3 % (ref 4.0–5.6)
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
## 2020-11-10 NOTE — Patient Instructions (Signed)
-  Nice seeing you today!!  -Knee xrays today.  -Referral to orthopedics has been placed.

## 2020-11-13 ENCOUNTER — Other Ambulatory Visit: Payer: Self-pay

## 2020-11-13 ENCOUNTER — Encounter: Payer: Self-pay | Admitting: Family Medicine

## 2020-11-13 ENCOUNTER — Ambulatory Visit: Payer: Medicare HMO | Admitting: Family Medicine

## 2020-11-13 DIAGNOSIS — M2391 Unspecified internal derangement of right knee: Secondary | ICD-10-CM | POA: Diagnosis not present

## 2020-11-13 DIAGNOSIS — M25561 Pain in right knee: Secondary | ICD-10-CM
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
## 2020-12-08 ENCOUNTER — Other Ambulatory Visit: Payer: Medicare HMO

## 2020-12-22 ENCOUNTER — Ambulatory Visit
Admission: RE | Admit: 2020-12-22 | Discharge: 2020-12-22 | Disposition: A | Discharge status: Home / Self Care | Payer: Medicare HMO | Source: Ambulatory Visit | Attending: Family Medicine | Admitting: Family Medicine

## 2020-12-22 DIAGNOSIS — M25561 Pain in right knee: Secondary | ICD-10-CM

## 2020-12-22 DIAGNOSIS — M7121 Synovial cyst of popliteal space [Baker], right knee: Secondary | ICD-10-CM | POA: Diagnosis not present

## 2020-12-22 DIAGNOSIS — S83206A Unspecified tear of unspecified meniscus, current injury, right knee, initial encounter: Secondary | ICD-10-CM | POA: Diagnosis not present

## 2020-12-22 DIAGNOSIS — M2391 Unspecified internal derangement of right knee: Secondary | ICD-10-CM

## 2021-02-10 ENCOUNTER — Other Ambulatory Visit: Payer: Self-pay

## 2021-02-10 ENCOUNTER — Encounter: Payer: Self-pay | Admitting: Internal Medicine

## 2021-02-10 ENCOUNTER — Ambulatory Visit (INDEPENDENT_AMBULATORY_CARE_PROVIDER_SITE_OTHER): Payer: Medicare HMO | Admitting: Internal Medicine

## 2021-02-10 VITALS — BP 118/80 | HR 73 | Temp 98.3°F | Ht 68.0 in | Wt 150.5 lb

## 2021-02-10 DIAGNOSIS — Z122 Encounter for screening for malignant neoplasm of respiratory organs: Secondary | ICD-10-CM | POA: Diagnosis not present

## 2021-02-10 DIAGNOSIS — Z0000 Encounter for general adult medical examination without abnormal findings: Secondary | ICD-10-CM

## 2021-02-10 DIAGNOSIS — R7302 Impaired glucose tolerance (oral): Secondary | ICD-10-CM

## 2021-02-10 DIAGNOSIS — Z125 Encounter for screening for malignant neoplasm of prostate: Secondary | ICD-10-CM

## 2021-02-10 DIAGNOSIS — Z136 Encounter for screening for cardiovascular disorders: Secondary | ICD-10-CM

## 2021-02-10 DIAGNOSIS — Z0100 Encounter for examination of eyes and vision without abnormal findings: Secondary | ICD-10-CM

## 2021-02-10 DIAGNOSIS — Z23 Encounter for immunization: Secondary | ICD-10-CM | POA: Diagnosis not present

## 2021-02-10 NOTE — Addendum Note (Signed)
Addended by: Nilda Riggs on: 02/10/2021 04:47 PM   Modules accepted: Orders
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
## 2021-02-10 NOTE — Addendum Note (Signed)
Addended by: Rosalyn Gess D on: 02/10/2021 03:08 PM   Modules accepted: Orders

## 2021-02-10 NOTE — Patient Instructions (Signed)
-  Nice seeing you today!!  -Lab work today; will notify you once results are available.  -Flu nad pneumonia vaccines today.  -Remember COVID booster and shingles vaccines at the pharmacy.  -Schedule follow up in 1 year or sooner as needed.

## 2021-02-11 LAB — LIPID PANEL
Cholesterol: 137 mg/dL (ref 0–200)
HDL: 48.8 mg/dL (ref >39.00)
LDL Cholesterol: 78 mg/dL (ref 0–99)
NonHDL: 87.96
Total CHOL/HDL Ratio: 3
Triglycerides: 49 mg/dL (ref 0.0–149.0)
VLDL: 9.8 mg/dL (ref 0.0–40.0)

## 2021-02-11 LAB — CBC WITH DIFFERENTIAL/PLATELET
Basophils Absolute: 0.1 10*3/uL (ref 0.0–0.1)
Basophils Relative: 0.9 % (ref 0.0–3.0)
Eosinophils Absolute: 0.2 10*3/uL (ref 0.0–0.7)
Eosinophils Relative: 3.3 % (ref 0.0–5.0)
HCT: 45.2 % (ref 39.0–52.0)
Hemoglobin: 15.1 g/dL (ref 13.0–17.0)
Lymphocytes Relative: 23.7 % (ref 12.0–46.0)
Lymphs Abs: 1.5 10*3/uL (ref 0.7–4.0)
MCHC: 33.4 g/dL (ref 30.0–36.0)
MCV: 92.6 fl (ref 78.0–100.0)
Monocytes Absolute: 0.7 10*3/uL (ref 0.1–1.0)
Monocytes Relative: 11.6 % (ref 3.0–12.0)
Neutro Abs: 3.7 10*3/uL (ref 1.4–7.7)
Neutrophils Relative %: 60.5 % (ref 43.0–77.0)
Platelets: 308 10*3/uL (ref 150.0–400.0)
RBC: 4.88 Mil/uL (ref 4.22–5.81)
RDW: 14.6 % (ref 11.5–15.5)
WBC: 6.1 10*3/uL (ref 4.0–10.5)

## 2021-02-11 LAB — COMPREHENSIVE METABOLIC PANEL
ALT: 15 U/L (ref 0–53)
AST: 18 U/L (ref 0–37)
Albumin: 4.2 g/dL (ref 3.5–5.2)
Alkaline Phosphatase: 76 U/L (ref 39–117)
BUN: 12 mg/dL (ref 6–23)
CO2: 27 mEq/L (ref 19–32)
Calcium: 9.4 mg/dL (ref 8.4–10.5)
Chloride: 103 mEq/L (ref 96–112)
Creatinine, Ser: 1.01 mg/dL (ref 0.40–1.50)
GFR: 76.72 mL/min (ref >60.00)
Glucose, Bld: 77 mg/dL (ref 70–99)
Potassium: 4.4 mEq/L (ref 3.5–5.1)
Sodium: 138 mEq/L (ref 135–145)
Total Bilirubin: 0.6 mg/dL (ref 0.2–1.2)
Total Protein: 7.4 g/dL (ref 6.0–8.3)

## 2021-02-11 LAB — PSA: PSA: 1.64 ng/mL (ref 0.10–4.00)

## 2021-02-11 LAB — HEMOGLOBIN A1C: Hgb A1c MFr Bld: 5.7 % (ref 4.6–6.5)

## 2021-06-07 DIAGNOSIS — H2513 Age-related nuclear cataract, bilateral: Secondary | ICD-10-CM | POA: Diagnosis not present

## 2021-06-07 DIAGNOSIS — H5201 Hypermetropia, right eye: Secondary | ICD-10-CM | POA: Diagnosis not present

## 2021-06-08 ENCOUNTER — Encounter: Payer: Self-pay | Admitting: Gastroenterology

## 2021-07-22 ENCOUNTER — Encounter: Payer: Self-pay | Admitting: Internal Medicine

## 2021-07-22 ENCOUNTER — Ambulatory Visit (INDEPENDENT_AMBULATORY_CARE_PROVIDER_SITE_OTHER): Payer: Medicare HMO | Admitting: Internal Medicine

## 2021-07-22 VITALS — BP 110/80 | HR 82 | Temp 98.1°F | Wt 149.8 lb

## 2021-07-22 DIAGNOSIS — M545 Low back pain, unspecified: Secondary | ICD-10-CM

## 2021-07-22 MED ORDER — KETOROLAC TROMETHAMINE 60 MG/2ML IM SOLN
60.0000 mg | Freq: Once | INTRAMUSCULAR | Status: AC
  Administered 2021-07-22: 60 mg via INTRAMUSCULAR

## 2021-07-22 MED ORDER — CYCLOBENZAPRINE HCL 5 MG PO TABS: 5.0000 mg | ORAL_TABLET | Freq: Every evening | ORAL | 0 refills | Status: DC | PRN
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
## 2021-07-22 NOTE — Patient Instructions (Signed)
-  Nice seeing you today!! ? ?-Toradol injection today. ? ?-Flexeril 5 mg at bedtime as needed. ? ?-Do back stretches daily. ? ?-Schedule follow up in 3 months for your physical. ? ?

## 2021-07-28 ENCOUNTER — Telehealth: Payer: Self-pay | Admitting: Internal Medicine

## 2021-07-28 DIAGNOSIS — M545 Low back pain, unspecified: Secondary | ICD-10-CM

## 2021-07-28 NOTE — Telephone Encounter (Signed)
Pt calling regarding back pain. Was seen on 07/22/21 and states pain still persists even with the muscle relaxers he was prescribed. Requesting "something stronger for the pain'.  ?[id number] - [place], [address] [place] Phone:  [phone]  ?Fax:  [phone]  ?  ? ?

## 2021-08-02 NOTE — Telephone Encounter (Signed)
Referral placed.

## 2022-06-07 ENCOUNTER — Encounter: Payer: Self-pay | Admitting: Internal Medicine

## 2022-06-07 ENCOUNTER — Ambulatory Visit (INDEPENDENT_AMBULATORY_CARE_PROVIDER_SITE_OTHER): Payer: Medicare HMO | Admitting: Internal Medicine

## 2022-06-07 VITALS — BP 120/88 | HR 82 | Temp 97.7°F | Wt 148.5 lb

## 2022-06-07 DIAGNOSIS — M5442 Lumbago with sciatica, left side: Secondary | ICD-10-CM

## 2022-06-07 MED ORDER — PREDNISONE 10 MG (21) PO TBPK: ORAL_TABLET | ORAL | 0 refills | Status: AC

## 2022-06-07 MED ORDER — KETOROLAC TROMETHAMINE 60 MG/2ML IM SOLN
60.0000 mg | Freq: Once | INTRAMUSCULAR | Status: AC
  Administered 2022-06-07: 60 mg via INTRAMUSCULAR
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
## 2022-06-16 ENCOUNTER — Telehealth: Payer: Self-pay | Admitting: Internal Medicine

## 2022-06-16 ENCOUNTER — Other Ambulatory Visit: Payer: Self-pay | Admitting: Internal Medicine

## 2022-06-16 DIAGNOSIS — M545 Low back pain, unspecified: Secondary | ICD-10-CM

## 2022-06-16 MED ORDER — CYCLOBENZAPRINE HCL 5 MG PO TABS: 5.0000 mg | ORAL_TABLET | Freq: Every evening | ORAL | 0 refills | Status: AC | PRN

## 2022-06-16 NOTE — Telephone Encounter (Signed)
Patient is aware.  Patient states that he will call PT and schedule an appointment.

## 2022-06-16 NOTE — Telephone Encounter (Signed)
Left message on machine for patient to return our call. Looks like the referral was closed after the patient did not return the calls for PT.

## 2022-06-16 NOTE — Telephone Encounter (Signed)
Patient returned call

## 2022-06-16 NOTE — Telephone Encounter (Signed)
Pt was last seen on 06/07/22 for sciatic pain.  Pt states the prednisone did nothing for him and he is still in a lot of pain.   The pain is in his legs & back, and he says he can barely walk without pain.   Pt is asking if MD would send an Rx for a muscle relaxer or something stronger for the pain?  Please advise.  [place] ([place]), [place] - [address] Phone: [phone]  Fax: [phone]

## 2023-01-20 ENCOUNTER — Ambulatory Visit (INDEPENDENT_AMBULATORY_CARE_PROVIDER_SITE_OTHER): Payer: Medicare HMO

## 2023-01-20 VITALS — Ht 68.0 in | Wt 148.0 lb

## 2023-01-20 DIAGNOSIS — Z0000 Encounter for general adult medical examination without abnormal findings: Secondary | ICD-10-CM | POA: Diagnosis not present
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
## 2023-01-20 NOTE — Patient Instructions (Addendum)
Mr. Amparan , Thank you for taking time to come for your Medicare Wellness Visit. I appreciate your ongoing commitment to your health goals. Please review the following plan we discussed and let me know if I can assist you in the future.   Referrals/Orders/Follow-Ups/Clinician Recommendations:   This is a list of the screening recommended for you and due dates:  Health Maintenance  Topic Date Due   Complete foot exam   Never done   Yearly kidney health urinalysis for diabetes  Never done   Hepatitis C Screening  Never done   Zoster (Shingles) Vaccine (1 of 2) Never done   Colon Cancer Screening  06/13/2021   Hemoglobin A1C  08/11/2021   Yearly kidney function blood test for diabetes  02/10/2022   Eye exam for diabetics  06/07/2022   Flu Shot  11/17/2022   COVID-19 Vaccine (4 - 2023-24 season) 12/18/2022   Medicare Annual Wellness Visit  01/20/2024   DTaP/Tdap/Td vaccine (2 - Td or Tdap) 02/08/2025   Pneumonia Vaccine  Completed   HPV Vaccine  Aged Out    Advanced directives: (Declined) Advance directive discussed with you today. Even though you declined this today, please call our office should you change your mind, and we can give you the proper paperwork for you to fill out.  Next Medicare Annual Wellness Visit scheduled for next year: Yes

## 2023-02-08 DIAGNOSIS — Z0100 Encounter for examination of eyes and vision without abnormal findings: Secondary | ICD-10-CM | POA: Diagnosis not present

## 2024-05-13 ENCOUNTER — Ambulatory Visit

## 2024-05-30 ENCOUNTER — Ambulatory Visit: Admitting: Family Medicine
